# Patient Record
Sex: Male | Born: 1960 | Race: White | Hispanic: No | Marital: Single | Smoking: Current every day smoker
Health system: Southern US, Community
[De-identification: ages and names within clinical notes are randomized; demographics above are authoritative.]

## PROBLEM LIST (undated history)

## (undated) DIAGNOSIS — E119 Type 2 diabetes mellitus without complications: Secondary | ICD-10-CM

## (undated) DIAGNOSIS — E039 Hypothyroidism, unspecified: Secondary | ICD-10-CM

## (undated) HISTORY — PX: NOSE SURGERY: SHX723

## (undated) HISTORY — DX: Type 2 diabetes mellitus without complications: E11.9

## (undated) HISTORY — DX: Hypothyroidism, unspecified: E03.9

## (undated) HISTORY — PX: KNEE SURGERY: SHX244

---

## 2009-01-07 ENCOUNTER — Emergency Department (HOSPITAL_COMMUNITY): Admission: EM | Admit: 2009-01-07 | Discharge: 2009-01-07 | Payer: Self-pay | Admitting: Emergency Medicine

## 2009-08-26 ENCOUNTER — Emergency Department (HOSPITAL_COMMUNITY): Admission: EM | Admit: 2009-08-26 | Discharge: 2009-08-26 | Payer: Self-pay | Admitting: Emergency Medicine

## 2009-09-11 ENCOUNTER — Emergency Department (HOSPITAL_COMMUNITY): Admission: EM | Admit: 2009-09-11 | Discharge: 2009-09-11 | Payer: Self-pay | Admitting: Emergency Medicine

## 2010-07-23 LAB — COMPREHENSIVE METABOLIC PANEL
AST: 22 U/L (ref 0–37)
Alkaline Phosphatase: 56 U/L (ref 39–117)
BUN: 15 mg/dL (ref 6–23)
Chloride: 109 mEq/L (ref 96–112)
Creatinine, Ser: 0.83 mg/dL (ref 0.4–1.5)
GFR calc non Af Amer: >60 mL/min (ref >60)
Glucose, Bld: 86 mg/dL (ref 70–99)
Potassium: 3.4 mEq/L — ABNORMAL LOW (ref 3.5–5.1)
Total Bilirubin: 0.3 mg/dL (ref 0.3–1.2)
Total Protein: 6.4 g/dL (ref 6.0–8.3)

## 2010-07-23 LAB — CBC
Platelets: 164 10*3/uL (ref 150–400)
WBC: 7.2 10*3/uL (ref 4.0–10.5)

## 2010-07-23 LAB — DIFFERENTIAL
Basophils Absolute: 0 10*3/uL (ref 0.0–0.1)
Basophils Relative: 1 % (ref 0–1)
Eosinophils Absolute: 0.1 10*3/uL (ref 0.0–0.7)

## 2010-07-23 LAB — POCT CARDIAC MARKERS: Myoglobin, poc: 34.8 ng/mL (ref 12–200)

## 2010-08-09 LAB — POCT CARDIAC MARKERS
Myoglobin, poc: 54.8 ng/mL (ref 12–200)
Troponin i, poc: <0.05 ng/mL (ref 0.00–0.09)

## 2010-08-09 LAB — BASIC METABOLIC PANEL
CO2: 27 mEq/L (ref 19–32)
Glucose, Bld: 119 mg/dL — ABNORMAL HIGH (ref 70–99)
Sodium: 138 mEq/L (ref 135–145)

## 2010-08-09 LAB — D-DIMER, QUANTITATIVE: D-Dimer, Quant: 0.34 ug/mL-FEU (ref 0.00–0.48)

## 2010-08-09 LAB — CBC
MCHC: 35 g/dL (ref 30.0–36.0)
MCV: 100.8 fL — ABNORMAL HIGH (ref 78.0–100.0)
Platelets: 206 10*3/uL (ref 150–400)
RDW: 14.7 % (ref 11.5–15.5)

## 2010-08-09 LAB — DIFFERENTIAL
Basophils Absolute: 0 10*3/uL (ref 0.0–0.1)
Lymphocytes Relative: 13 % (ref 12–46)
Lymphs Abs: 1.3 10*3/uL (ref 0.7–4.0)
Monocytes Absolute: 0.5 10*3/uL (ref 0.1–1.0)
Neutrophils Relative %: 82 % — ABNORMAL HIGH (ref 43–77)
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
## 2022-05-13 ENCOUNTER — Ambulatory Visit (INDEPENDENT_AMBULATORY_CARE_PROVIDER_SITE_OTHER): Payer: No Typology Code available for payment source | Admitting: "Endocrinology

## 2022-05-13 ENCOUNTER — Encounter: Payer: Self-pay | Admitting: "Endocrinology

## 2022-05-13 VITALS — BP 114/76 | HR 68 | Ht 70.0 in | Wt 181.6 lb

## 2022-05-13 DIAGNOSIS — Z794 Long term (current) use of insulin: Secondary | ICD-10-CM

## 2022-05-13 DIAGNOSIS — E039 Hypothyroidism, unspecified: Secondary | ICD-10-CM

## 2022-05-13 DIAGNOSIS — E1165 Type 2 diabetes mellitus with hyperglycemia: Secondary | ICD-10-CM

## 2022-05-13 DIAGNOSIS — E782 Mixed hyperlipidemia: Secondary | ICD-10-CM | POA: Diagnosis not present

## 2022-05-13 DIAGNOSIS — F17200 Nicotine dependence, unspecified, uncomplicated: Secondary | ICD-10-CM | POA: Insufficient documentation

## 2022-05-13 DIAGNOSIS — I10 Essential (primary) hypertension: Secondary | ICD-10-CM

## 2022-05-13 NOTE — Progress Notes (Signed)
Endocrinology Consult Note       05/13/2022, 4:09 PM   Subjective:    Patient ID: Colin Austin, male    DOB: 1960-05-09.  Colin Austin is being seen in consultation for management of currently uncontrolled symptomatic diabetes requested by  [facility] [employer].   Past Medical History:  Diagnosis Date   Diabetes mellitus, type II (HCC)    Hypothyroidism     Past Surgical History:  Procedure Laterality Date   KNEE SURGERY     NOSE SURGERY      Social History   Socioeconomic History   Marital status: Single    Spouse name: Not on file   Number of children: Not on file   Years of education: Not on file   Highest education level: Not on file  Occupational History   Not on file  Tobacco Use   Smoking status: Every Day    Types: Cigarettes   Smokeless tobacco: Not on file  Vaping Use   Vaping Use: Never used  Substance and Sexual Activity   Alcohol use: Not Currently   Drug use: Never   Sexual activity: Not on file  Other Topics Concern   Not on file  Social History Narrative   Not on file   Social Determinants of Health   Financial Resource Strain: Not on file  Food Insecurity: Not on file  Transportation Needs: Not on file  Physical Activity: Not on file  Stress: Not on file  Social Connections: Not on file    Family History  Problem Relation Age of Onset   Cancer Mother    Thyroid disease Mother    Diabetes Mother    Cancer Father    Hypertension Father    Diabetes Father    Hyperlipidemia Father    Heart attack Father    Stroke Father     Outpatient Encounter Medications as of 05/13/2022  Medication Sig   albuterol (VENTOLIN HFA) 108 (90 Base) MCG/ACT inhaler Inhale 2 puffs every 4 hours by inhalation route.   Budeson-Glycopyrrol-Formoterol 160-9-4.8 MCG/ACT AERO INHALE 2 PUFFS BY ORAL INHALATION TWICE A DAY . RINSE MOUTH AFTER EACH USE TO PREVENT ORAL THRUSH.    budesonide (PULMICORT) 0.5 MG/2ML nebulizer solution Inhale 2 mL twice a day by nebulization route for 90 days.   buPROPion (ZYBAN) 150 MG 12 hr tablet Take 150 mg by mouth daily.   hydrOXYzine (ATARAX) 25 MG tablet Take 25 mg by mouth 3 (three) times daily.   insulin glargine-yfgn (SEMGLEE) 100 UNIT/ML Pen Inject 20 Units into the skin at bedtime.   levothyroxine (SYNTHROID) 175 MCG tablet Take 175 mcg by mouth daily before breakfast.   metFORMIN (GLUCOPHAGE) 1000 MG tablet Take 1,000 mg by mouth 2 (two) times daily with a meal.   naltrexone (DEPADE) 50 MG tablet Take 50 mg by mouth daily.   Omega-3 Fatty Acids (FISH OIL OMEGA-3 PO) Take 2 capsules by mouth daily.   omeprazole (PRILOSEC) 40 MG capsule Take 1 capsule by mouth daily.   PARoxetine (PAXIL) 20 MG tablet Take 20 mg by mouth daily.   pregabalin (LYRICA) 200 MG capsule Take  200 mg by mouth 2 (two) times daily.   rosuvastatin (CRESTOR) 10 MG tablet Take 10 mg by mouth daily.   sildenafil (VIAGRA) 100 MG tablet Take 100 mg by mouth as needed.   traZODone (DESYREL) 50 MG tablet Take 50 mg by mouth at bedtime.   [DISCONTINUED] clonazePAM (KLONOPIN PO) 0.5 mg daily.   No facility-administered encounter medications on file as of 05/13/2022.    ALLERGIES: No Known Allergies  VACCINATION STATUS: Immunization History  Administered Date(s) Administered   PFIZER(Purple Top)SARS-COV-2 Vaccination 04/10/2020    Diabetes He presents for his initial diabetic visit. He has type 2 diabetes mellitus. Onset time: He was diagnosed at the proximal range of 62 years. There are no hypoglycemic associated symptoms. Pertinent negatives for hypoglycemia include no confusion, headaches, pallor or seizures. Associated symptoms include polydipsia and polyuria. Pertinent negatives for diabetes include no chest pain, no fatigue, no polyphagia and no weakness. There are no hypoglycemic complications. Symptoms are stable. Risk factors for coronary artery  disease include dyslipidemia, diabetes mellitus, family history, sedentary lifestyle, tobacco exposure and male sex. Current diabetic treatment includes oral agent (monotherapy). His weight is fluctuating minimally. He is following a generally unhealthy diet. When asked about meal planning, he reported none. He has not had a previous visit with a dietitian. He rarely participates in exercise. His home blood glucose trend is fluctuating minimally. (Gives history of alcohol abuse/chronic use requiring alcohol detox in November 2023. He did not bring any logs nor meter with him to review.  His recent point-of-care A1c was 9.7%.  He is on metformin 1000 mg p.o. twice daily.  He is also on Semglee 15 units nightly.) An ACE inhibitor/angiotensin II receptor blocker is not being taken.  Hyperlipidemia This is a chronic problem. The current episode started more than 1 year ago. The problem is controlled. Exacerbating diseases include diabetes. Pertinent negatives include no chest pain, myalgias or shortness of breath. Current antihyperlipidemic treatment includes statins. Risk factors for coronary artery disease include diabetes mellitus, dyslipidemia, male sex and a sedentary lifestyle.     Review of Systems  Constitutional:  Negative for chills, fatigue, fever and unexpected weight change.  HENT:  Negative for dental problem, mouth sores and trouble swallowing.   Eyes:  Negative for visual disturbance.  Respiratory:  Negative for cough, choking, chest tightness, shortness of breath and wheezing.   Cardiovascular:  Negative for chest pain, palpitations and leg swelling.  Gastrointestinal:  Negative for abdominal distention, abdominal pain, constipation, diarrhea, nausea and vomiting.  Endocrine: Positive for polydipsia and polyuria. Negative for polyphagia.  Genitourinary:  Negative for dysuria, flank pain, hematuria and urgency.  Musculoskeletal:  Negative for back pain, gait problem, myalgias and neck  pain.  Skin:  Negative for pallor, rash and wound.  Neurological:  Negative for seizures, syncope, weakness, numbness and headaches.  Psychiatric/Behavioral: Negative.  Negative for confusion and dysphoric mood.     Objective:       05/13/2022    8:52 AM  Vitals with BMI  Height 5\' 10"   Weight 181 lbs 10 oz  BMI 27.25  Systolic 366  Diastolic 76  Pulse 68    BP 114/76   Pulse 68   Ht 5\' 10"  (1.778 m)   Wt 181 lb 9.6 oz (82.4 kg)   BMI 26.06 kg/m   Wt Readings from Last 3 Encounters:  05/13/22 181 lb 9.6 oz (82.4 kg)     Physical Exam Constitutional:      General: He is  not in acute distress.    Appearance: He is well-developed.  HENT:     Head: Normocephalic and atraumatic.  Neck:     Thyroid: No thyromegaly.     Trachea: No tracheal deviation.  Cardiovascular:     Rate and Rhythm: Normal rate.     Pulses:          Dorsalis pedis pulses are 1+ on the right side and 1+ on the left side.       Posterior tibial pulses are 1+ on the right side and 1+ on the left side.     Heart sounds: Normal heart sounds, S1 normal and S2 normal. No murmur heard.    No gallop.  Pulmonary:     Effort: Pulmonary effort is normal. No respiratory distress.     Breath sounds: No wheezing.     Comments: Bilateral wheezes on lung auscultation. Abdominal:     General: Bowel sounds are normal. There is no distension.     Palpations: Abdomen is soft.     Tenderness: There is no abdominal tenderness. There is no guarding.  Musculoskeletal:     Right shoulder: No swelling or deformity.     Cervical back: Normal range of motion and neck supple.  Skin:    General: Skin is warm and dry.     Findings: No rash.     Nails: There is no clubbing.  Neurological:     Mental Status: He is alert and oriented to person, place, and time.     Cranial Nerves: No cranial nerve deficit.     Sensory: No sensory deficit.     Gait: Gait normal.     Deep Tendon Reflexes: Reflexes are normal and symmetric.   Psychiatric:        Speech: Speech normal.        Behavior: Behavior normal. Behavior is cooperative.        Thought Content: Thought content normal.        Judgment: Judgment normal.       CMP ( most recent) CMP     Component Value Date/Time   NA 139 08/26/2009 1925   K 3.4 (L) 08/26/2009 1925   CL 109 08/26/2009 1925   CO2 23 08/26/2009 1925   GLUCOSE 86 08/26/2009 1925   BUN 15 08/26/2009 1925   CREATININE 0.83 08/26/2009 1925   CALCIUM 8.5 08/26/2009 1925   PROT 6.4 08/26/2009 1925   ALBUMIN 3.9 08/26/2009 1925   AST 22 08/26/2009 1925   ALT 22 08/26/2009 1925   ALKPHOS 56 08/26/2009 1925   BILITOT 0.3 08/26/2009 1925   GFRNONAA >60 08/26/2009 1925   GFRAA  08/26/2009 1925    >60        The eGFR has been calculated using the MDRD equation. This calculation has not been validated in all clinical situations. eGFR's persistently <60 mL/min signify possible Chronic Kidney Disease.        Assessment & Plan:   1. 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 Recent labs reviewed. - I had a long discussion with him about the possible risk factors and  the pathology behind its diabetes and its complications. -his diabetes is complicated by alcoholism, chronic smoking, sedentary life and he remains at a high risk for more  acute and chronic complications which include CAD, CVA, CKD, retinopathy, and neuropathy. These are all discussed in detail with him.  - I discussed all available options of managing his diabetes including de-escalation of medications. I have counseled him on Food as Medicine by adopting a Whole Food , Plant Predominant  ( WFPP) nutrition as recommended by [employer] of Lifestyle Medicine. Patient is encouraged to switch to  unprocessed or  minimally processed  complex starch, adequate protein intake (mainly plant source), minimal liquid fat, plenty of fruits, and vegetables. -  he is advised to stick to a routine mealtimes to eat 3 complete meals a day and snack only when necessary ( to snack only to correct hypoglycemia BG <70 day time or <100 at night).   - he acknowledges that there is a room for improvement in his food and drink choices. - Further Specific Suggestion is made for him to avoid simple carbohydrates  from his diet including Cakes, Sweet Desserts, Ice Cream, Soda (diet and regular), Sweet Tea, Candies, Chips, Cookies, Store Bought Juices, Alcohol ,  Artificial Sweeteners,  Coffee Creamer, and "Sugar-free" Products. This will help patient to have more stable blood glucose profile and potentially avoid unintended weight gain.  The following Lifestyle Medicine recommendations according to American College of Lifestyle Medicine [facility]) were discussed and offered to patient and he agrees to start the journey:  A.  Whole Foods, Plant-based plate comprising of fruits and vegetables, plant-based proteins, whole-grain carbohydrates was discussed in detail with the patient.   A list for source of those nutrients were also provided to the patient.  Patient will use only water or unsweetened tea for hydration. B.  The need to stay away from risky substances including alcohol, smoking; obtaining 7 to 9 hours of restorative sleep, at least 150 minutes of moderate intensity exercise weekly, the importance of healthy social connections,  and stress reduction techniques were discussed. C.  A full color page of  Calorie density of various food groups per pound showing examples of each food groups was provided to the patient.  - he will be scheduled with Norm Salt, RDN, CDE for individualized diabetes education.  - I have approached him with the following individualized plan to manage  his diabetes and patient agrees:  This patient  would likely need insulin and basal/bolus fashion in order for him to control diabetes to target.  In the meantime, he is advised to increase his Semglee to 20 units nightly, advised to continue metformin 1000 mg p.o. twice daily.  He is approached to monitor blood glucose 4 times a day-before meals and at bedtime and return in 10 days with his meter and logs.   - he is encouraged to call clinic for blood glucose levels less than 70 or above 300 mg /dl. He is not a suitable candidate for DPP 4 inhibitors nor GLP-1 receptor agonist due to risk of pancreatitis.  - Specific targets for  A1c;  LDL, HDL,  and Triglycerides were discussed with the patient.  2) Blood Pressure /Hypertension:  his blood pressure is  controlled to target.  He is not on antihypertensive medications, may not tolerate. 3) Lipids/Hyperlipidemia:   Review of his recent lipid panel showed  controlled  LDL .  he  is advised to continue Crestor 10 mg daily at bedtime.  Side effects and precautions discussed with him.  4)  Weight/Diet:  Body mass index is 26.06 kg/m.  -    he is not a candidate  for weight loss. I discussed with him the fact that loss of 5 - 10% of his  current body weight will have the most impact on his diabetes management.  The above detailed  ACLM recommendations for nutrition, exercise, sleep, social life, avoidance of risky substances, the need for restorative sleep   information will also detailed on discharge instructions.  5) hypothyroidism: The circumstance of his diagnosis are not available to review.  He is currently on levothyroxine 175 mcg p.o. daily before breakfast.  - We discussed about the correct intake of his thyroid hormone, on empty stomach at fasting, with water, separated by at least 30 minutes from breakfast and other medications,  and separated by more than 4 hours from calcium, iron, multivitamins, acid reflux medications (PPIs). -Patient is made aware of the fact that thyroid hormone  replacement is needed for life, dose to be adjusted by periodic monitoring of thyroid function tests.   6) Chronic Care/Health Maintenance:  -he  is on  Statin medications , will not tolerate ACE inhibitor's and  is encouraged to initiate and continue to follow up with Ophthalmology, Dentist,  Podiatrist at least yearly or according to recommendations, and advised to  quit smoking. I have recommended yearly flu vaccine and pneumonia vaccine at least every 5 years; moderate intensity exercise for up to 150 minutes weekly; and  sleep for 7- 9 hours a day.  - he is  advised to maintain close follow up with [facility] for primary care needs, as well as his other providers for optimal and coordinated care.   I spent 65 minutes in the care of the patient today including review of labs from CMP, Lipids, Thyroid Function, Hematology (current and previous including abstractions from other facilities); face-to-face time discussing  his blood glucose readings/logs, discussing hypoglycemia and hyperglycemia episodes and symptoms, medications doses, his options of short and long term treatment based on the latest standards of care / guidelines;  discussion about incorporating lifestyle medicine;  and documenting the encounter. Risk reduction counseling performed per USPSTF guidelines to reduce  cardiovascular risk factors.      Please refer to Patient Instructions for Blood Glucose Monitoring and Insulin/Medications Dosing Guide"  in media tab for additional information. Please  also refer to " Patient Self Inventory" in the Media  tab for reviewed elements of pertinent patient history.  Colin Austin participated in the discussions, expressed understanding, and voiced agreement with the above plans.  All questions were answered to his satisfaction. he is encouraged to contact clinic should he have any questions or concerns prior to his return visit.   Follow up plan: - Return in about 10 days  (around 05/23/2022) for F/U with Meter/CGM Megan Salon Only - no Labs.  Marquis Lunch, MD [facility] [address] [place], [place] [address] Phone: [phone]  Fax: [phone]    05/13/2022, 4:09 PM  This note was partially dictated with voice recognition software. Similar sounding words can be transcribed inadequately or may not  be corrected upon review.

## 2022-05-13 NOTE — Patient Instructions (Signed)

## 2022-05-28 ENCOUNTER — Ambulatory Visit (INDEPENDENT_AMBULATORY_CARE_PROVIDER_SITE_OTHER): Payer: No Typology Code available for payment source | Admitting: "Endocrinology

## 2022-05-28 ENCOUNTER — Encounter: Payer: Self-pay | Admitting: "Endocrinology

## 2022-05-28 VITALS — BP 124/58 | HR 68 | Ht 70.0 in | Wt 186.4 lb

## 2022-05-28 DIAGNOSIS — E782 Mixed hyperlipidemia: Secondary | ICD-10-CM | POA: Diagnosis not present

## 2022-05-28 DIAGNOSIS — I10 Essential (primary) hypertension: Secondary | ICD-10-CM | POA: Diagnosis not present

## 2022-05-28 DIAGNOSIS — F17200 Nicotine dependence, unspecified, uncomplicated: Secondary | ICD-10-CM

## 2022-05-28 DIAGNOSIS — E1165 Type 2 diabetes mellitus with hyperglycemia: Secondary | ICD-10-CM

## 2022-05-28 DIAGNOSIS — Z794 Long term (current) use of insulin: Secondary | ICD-10-CM

## 2022-05-28 DIAGNOSIS — E039 Hypothyroidism, unspecified: Secondary | ICD-10-CM

## 2022-05-28 DIAGNOSIS — N529 Male erectile dysfunction, unspecified: Secondary | ICD-10-CM

## 2022-05-28 DIAGNOSIS — L853 Xerosis cutis: Secondary | ICD-10-CM

## 2022-05-28 MED ORDER — INSULIN GLARGINE-YFGN 100 UNIT/ML ~~LOC~~ SOPN: 20.0000 [IU] | PEN_INJECTOR | Freq: Every day | SUBCUTANEOUS | 1 refills | Status: DC

## 2022-05-28 NOTE — Progress Notes (Signed)
Endocrinology follow-up note       05/28/2022, 7:45 PM   Subjective:    Patient ID: Colin Austin, male    DOB: 1960-06-10.  Colin Austin is being seen in follow-up after he was sitting consultation for management of currently uncontrolled symptomatic diabetes requested by  Center, [facility] [facility].   Past Medical History:  Diagnosis Date   Diabetes mellitus, type II ([place])    Hypothyroidism     Past Surgical History:  Procedure Laterality Date   KNEE SURGERY     NOSE SURGERY      Social History   Socioeconomic History   Marital status: Single    Spouse name: Not on file   Number of children: Not on file   Years of education: Not on file   Highest education level: Not on file  Occupational History   Not on file  Tobacco Use   Smoking status: Every Day    Types: Cigarettes   Smokeless tobacco: Not on file  Vaping Use   Vaping Use: Never used  Substance and Sexual Activity   Alcohol use: Not Currently   Drug use: Never   Sexual activity: Not on file  Other Topics Concern   Not on file  Social History Narrative   Not on file   Social Determinants of Health   Financial Resource Strain: Not on file  Food Insecurity: Not on file  Transportation Needs: Not on file  Physical Activity: Not on file  Stress: Not on file  Social Connections: Not on file    Family History  Problem Relation Age of Onset   Cancer Mother    Thyroid disease Mother    Diabetes Mother    Cancer Father    Hypertension Father    Diabetes Father    Hyperlipidemia Father    Heart attack Father    Stroke Father     Outpatient Encounter Medications as of 05/28/2022  Medication Sig   albuterol (VENTOLIN HFA) 108 (90 Base) MCG/ACT inhaler Inhale 2 puffs every 4 hours by inhalation route.   Budeson-Glycopyrrol-Formoterol 160-9-4.8 MCG/ACT AERO INHALE 2 PUFFS BY ORAL INHALATION TWICE A DAY . RINSE MOUTH AFTER  EACH USE TO PREVENT ORAL THRUSH.   budesonide (PULMICORT) 0.5 MG/2ML nebulizer solution Inhale 2 mL twice a day by nebulization route for 90 days.   buPROPion (ZYBAN) 150 MG 12 hr tablet Take 150 mg by mouth daily.   hydrOXYzine (ATARAX) 25 MG tablet Take 25 mg by mouth 3 (three) times daily.   insulin glargine-yfgn (SEMGLEE) 100 UNIT/ML Pen Inject 20 Units into the skin at bedtime.   levothyroxine (SYNTHROID) 175 MCG tablet Take 175 mcg by mouth daily before breakfast.   metFORMIN (GLUCOPHAGE) 1000 MG tablet Take 1,000 mg by mouth 2 (two) times daily with a meal.   naltrexone (DEPADE) 50 MG tablet Take 50 mg by mouth daily.   Omega-3 Fatty Acids (FISH OIL OMEGA-3 PO) Take 2 capsules by mouth daily.   omeprazole (PRILOSEC) 40 MG capsule Take 1 capsule by mouth daily.   PARoxetine (PAXIL) 20 MG tablet Take 20 mg by mouth daily.   pregabalin (LYRICA) 200  MG capsule Take 200 mg by mouth 2 (two) times daily.   rosuvastatin (CRESTOR) 10 MG tablet Take 10 mg by mouth daily.   sildenafil (VIAGRA) 100 MG tablet Take 100 mg by mouth as needed.   traZODone (DESYREL) 50 MG tablet Take 50 mg by mouth at bedtime.   [DISCONTINUED] insulin glargine-yfgn (SEMGLEE) 100 UNIT/ML Pen Inject 20 Units into the skin at bedtime.   No facility-administered encounter medications on file as of 05/28/2022.    ALLERGIES: No Known Allergies  VACCINATION STATUS: Immunization History  Administered Date(s) Administered   PFIZER(Purple Top)SARS-COV-2 Vaccination 04/10/2020    Diabetes He presents for his follow-up diabetic visit. He has type 2 diabetes mellitus. Onset time: He was diagnosed at the proximal range of 59 years. His disease course has been improving. There are no hypoglycemic associated symptoms. Pertinent negatives for hypoglycemia include no confusion, headaches, pallor or seizures. Associated symptoms include polydipsia and polyuria. Pertinent negatives for diabetes include no chest pain, no fatigue, no  polyphagia and no weakness. There are no hypoglycemic complications. Symptoms are stable. Risk factors for coronary artery disease include dyslipidemia, diabetes mellitus, family history, sedentary lifestyle, tobacco exposure and male sex. Current diabetic treatment includes oral agent (monotherapy). His weight is increasing steadily. He is following a generally unhealthy diet. When asked about meal planning, he reported none. He has not had a previous visit with a dietitian. He rarely participates in exercise. His home blood glucose trend is decreasing steadily. His breakfast blood glucose range is generally 130-140 mg/dl. His bedtime blood glucose range is generally 140-180 mg/dl. His overall blood glucose range is 140-180 mg/dl. (Gives history of alcohol abuse/chronic use requiring alcohol detox in November 2023.  He presents with significantly improved glycemic profile after making some changes in his diet including decreased consumption of sweetened beverages.  He remains on Semglee 20 units nightly and metformin 1000 mg p.o. twice daily.  He did not develop any hypoglycemia.  His recent A1c was 9.7%.  ) An ACE inhibitor/angiotensin II receptor blocker is not being taken.  Hyperlipidemia This is a chronic problem. The current episode started more than 1 year ago. The problem is controlled. Exacerbating diseases include diabetes. Pertinent negatives include no chest pain, myalgias or shortness of breath. Current antihyperlipidemic treatment includes statins. Risk factors for coronary artery disease include diabetes mellitus, dyslipidemia, male sex and a sedentary lifestyle.     Review of Systems  Constitutional:  Negative for chills, fatigue, fever and unexpected weight change.  HENT:  Negative for dental problem, mouth sores and trouble swallowing.   Eyes:  Negative for visual disturbance.  Respiratory:  Negative for cough, choking, chest tightness, shortness of breath and wheezing.    Cardiovascular:  Negative for chest pain, palpitations and leg swelling.  Gastrointestinal:  Negative for abdominal distention, abdominal pain, constipation, diarrhea, nausea and vomiting.  Endocrine: Positive for polydipsia and polyuria. Negative for polyphagia.  Genitourinary:  Negative for dysuria, flank pain, hematuria and urgency.  Musculoskeletal:  Negative for back pain, gait problem, myalgias and neck pain.  Skin:  Negative for pallor, rash and wound.  Neurological:  Negative for seizures, syncope, weakness, numbness and headaches.  Psychiatric/Behavioral: Negative.  Negative for confusion and dysphoric mood.     Objective:       05/28/2022   10:52 AM 05/13/2022    8:52 AM  Vitals with BMI  Height 5\' 10"  5\' 10"   Weight 186 lbs 6 oz 181 lbs 10 oz  BMI 81.44 81.85  Systolic  124 114  Diastolic 58 76  Pulse 68 68    BP (!) 124/58   Pulse 68   Ht 5\' 10"  (1.778 m)   Wt 186 lb 6.4 oz (84.6 kg)   BMI 26.75 kg/m   Wt Readings from Last 3 Encounters:  05/28/22 186 lb 6.4 oz (84.6 kg)  05/13/22 181 lb 9.6 oz (82.4 kg)      CMP ( most recent) CMP     Component Value Date/Time   NA 139 08/26/2009 1925   K 3.4 (L) 08/26/2009 1925   CL 109 08/26/2009 1925   CO2 23 08/26/2009 1925   GLUCOSE 86 08/26/2009 1925   BUN 15 08/26/2009 1925   CREATININE 0.83 08/26/2009 1925   CALCIUM 8.5 08/26/2009 1925   PROT 6.4 08/26/2009 1925   ALBUMIN 3.9 08/26/2009 1925   AST 22 08/26/2009 1925   ALT 22 08/26/2009 1925   ALKPHOS 56 08/26/2009 1925   BILITOT 0.3 08/26/2009 1925   GFRNONAA >60 08/26/2009 1925   GFRAA  08/26/2009 1925    >60        The eGFR has been calculated using the MDRD equation. This calculation has not been validated in all clinical situations. eGFR's persistently <60 mL/min signify possible Chronic Kidney Disease.     Assessment & Plan:   1. 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 Gives history of alcohol abuse/chronic use requiring alcohol detox in November 2023.  He presents with significantly improved glycemic profile after making some changes in his diet including decreased consumption of sweetened beverages.  He remains on Semglee 20 units nightly and metformin 1000 mg p.o. twice daily.  He did not develop any hypoglycemia.  His recent A1c was 9.7%.  - I had a long discussion with him about the possible risk factors and  the pathology behind its diabetes and its complications. 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 These are all discussed in detail with him.  - I discussed all available options of managing his diabetes including de-escalation of medications. I have counseled him on Food as Medicine by adopting a Whole Food , Plant Predominant  ( WFPP) nutrition as recommended by December 2023 of Lifestyle Medicine. Patient is encouraged to switch to  unprocessed or minimally processed  complex starch, adequate protein intake (mainly plant source), minimal liquid fat, plenty of fruits, and vegetables. -  he is advised to stick to a routine mealtimes to eat 3 complete meals a day and snack only when necessary ( to snack only to correct hypoglycemia BG <70 day time or <100 at night).   - he acknowledges that there is a room for improvement in his food and drink choices.  The most important lifestyle changes to make is staying off of alcohol.   - Suggestion is made for him to avoid simple carbohydrates  from his diet including Cakes, Sweet Desserts, Ice Cream, Soda (diet and regular), Sweet Tea, Candies, Chips, Cookies, Store Bought Juices, Alcohol , Artificial Sweeteners,  Coffee  Creamer, and "Sugar-free" Products, Lemonade. This will help patient to have more stable blood glucose profile and potentially avoid unintended weight gain.  The following Lifestyle Medicine recommendations according  to [employer] of Lifestyle Medicine  [facility]) were discussed and and offered to patient and he  agrees to start the journey:  A. Whole Foods, Plant-Based Nutrition comprising of fruits and vegetables, plant-based proteins, whole-grain carbohydrates was discussed in detail with the patient.   A list for source of those nutrients were also provided to the patient.  Patient will use only water or unsweetened tea for hydration. B.  The need to stay away from risky substances including alcohol, smoking; obtaining 7 to 9 hours of restorative sleep, at least 150 minutes of moderate intensity exercise weekly, the importance of healthy social connections,  and stress management techniques were discussed. C.  A full color page of  Calorie density of various food groups per pound showing examples of each food groups was provided to the patient.   - he will be scheduled with Norm Salt, RDN, CDE for individualized diabetes education.  - I have approached him with the following individualized plan to manage  his diabetes and patient agrees:  Given his history of heavy alcohol history, this patient would likely need  basal/bolus fashion in order for him to control diabetes to target.  However, in the meantime he is achieving reasonable control.  He is advised to continue Semglee 20 units nightly, continue metformin 1000 mg p.o. twice daily. -He is willing and advised to continue monitoring blood glucose twice a day-daily before breakfast and at bedtime.  - he is encouraged to call clinic for blood glucose levels less than 70 or above 300 mg /dl. He is not a suitable candidate for DPP 4 inhibitors nor GLP-1 receptor agonist due to risk of pancreatitis.  - Specific targets for  A1c;  LDL,  HDL,  and Triglycerides were discussed with the patient.  2) Blood Pressure /Hypertension:   His blood pressure is controlled to target.   He is not on antihypertensive medications, may not tolerate. 3) Lipids/Hyperlipidemia:   Review of his recent lipid panel showed  controlled  LDL .  he  is advised to continue Crestor 10 mg p.o. daily at bedtime.  Side effects and precautions discussed with him.  4)  Weight/Diet:  Body mass index is 26.75 kg/m.  -    he is not a candidate for weight loss. I discussed with him the fact that loss of 5 - 10% of his  current body weight will have the most impact on his diabetes management.  The above detailed  ACLM recommendations for nutrition, exercise, sleep, social life, avoidance of risky substances, the need for restorative sleep   information will also detailed on discharge instructions.  5) hypothyroidism: The circumstance of his diagnosis are not available to review.  He is currently on levothyroxine 175 mcg p.o. daily before breakfast.    - We discussed about the correct intake of his thyroid hormone, on empty stomach at fasting, with water, separated by at least 30 minutes from breakfast and other medications,  and separated by more than 4 hours from calcium, iron, multivitamins, acid reflux medications (PPIs). -Patient is made aware of the fact that thyroid hormone replacement is needed for life, dose to be adjusted by periodic monitoring of thyroid function tests.   6) Chronic Care/Health Maintenance:  -he  is on  Statin medications , will not tolerate ACE inhibitor's and  is encouraged to initiate and continue to follow up with Ophthalmology, Dentist,  Podiatrist at least yearly or according to recommendations, and advised to  quit smoking. I have  recommended yearly flu vaccine and pneumonia vaccine at least every 5 years; moderate intensity exercise for up to 150 minutes weekly; and  sleep for 7- 9 hours a day.  The patient was counseled on the  dangers of tobacco use, and was advised to quit.  Reviewed strategies to maximize success, including removing cigarettes and smoking materials from environment.   - he is  advised to maintain close follow up with [facility] for primary care needs, as well as his other providers for optimal and coordinated care.    Follow up plan: - Return in about 3 months (around 08/27/2022) for F/U with Pre-visit Labs, Meter/CGM/Logs, A1c here.  Marquis Lunch, MD [facility] [address] [place], [place] [address] Phone: [phone]  Fax: [phone]    05/28/2022, 7:45 PM  This note was partially dictated with voice recognition software. Similar sounding words can be transcribed inadequately or may not  be corrected upon review.

## 2022-05-28 NOTE — Patient Instructions (Signed)

## 2022-06-06 ENCOUNTER — Other Ambulatory Visit: Payer: Self-pay

## 2022-06-06 DIAGNOSIS — E1165 Type 2 diabetes mellitus with hyperglycemia: Secondary | ICD-10-CM

## 2022-06-06 MED ORDER — PEN NEEDLES 30G X 8 MM MISC: 2 refills | Status: DC

## 2022-06-23 ENCOUNTER — Ambulatory Visit: Payer: Self-pay | Admitting: Nutrition

## 2022-07-14 ENCOUNTER — Encounter: Payer: No Typology Code available for payment source | Attending: "Endocrinology | Admitting: Nutrition

## 2022-07-14 DIAGNOSIS — E1165 Type 2 diabetes mellitus with hyperglycemia: Secondary | ICD-10-CM | POA: Insufficient documentation

## 2022-07-14 DIAGNOSIS — Z794 Long term (current) use of insulin: Secondary | ICD-10-CM | POA: Insufficient documentation

## 2022-07-14 DIAGNOSIS — Z713 Dietary counseling and surveillance: Secondary | ICD-10-CM | POA: Insufficient documentation

## 2022-07-28 ENCOUNTER — Encounter: Payer: No Typology Code available for payment source | Admitting: Nutrition

## 2022-07-28 ENCOUNTER — Encounter: Payer: Self-pay | Admitting: Nutrition

## 2022-07-28 VITALS — Ht 70.0 in | Wt 196.0 lb

## 2022-07-28 DIAGNOSIS — I10 Essential (primary) hypertension: Secondary | ICD-10-CM

## 2022-07-28 DIAGNOSIS — Z713 Dietary counseling and surveillance: Secondary | ICD-10-CM | POA: Diagnosis not present

## 2022-07-28 DIAGNOSIS — Z794 Long term (current) use of insulin: Secondary | ICD-10-CM | POA: Diagnosis not present

## 2022-07-28 DIAGNOSIS — E782 Mixed hyperlipidemia: Secondary | ICD-10-CM

## 2022-07-28 DIAGNOSIS — E1165 Type 2 diabetes mellitus with hyperglycemia: Secondary | ICD-10-CM

## 2022-07-28 NOTE — Progress Notes (Signed)
Medical Nutrition Therapy  Appointment Start time:  1500  Appointment End time:  1600  Primary concerns today: DM Dm Type 2   Referral diagnosis: E11. Preferred learning style: NO Preference. Learning readiness: Change in progress.   NUTRITION ASSESSMENT  62  yr old wmale here for Type 2 DM Education. Sees Dr. Dorris Fetch, Endocrinology. He notes his last A1C was 13% when he was in the hospital with Pancreatitis. H/o alcoholism. Has been sober for 4-5 months now. Attends zoom meetings with the [facility] online for support. Has had DM for 5 years or so.  Goes to the [place] and [place] for his medical appts.  He has been taking his insulin as prescribed. Has changed his eating habits and his BS are much better. FBS 110-140's Bedtime 150's. Semglee 20 units at night, Metformin 1000 mg BID.  He notes he cut out a lot of processed junk food and eating more fruits, vegetables and leaner meats. Still eats some animal meat.  Still smoking but working on cutting down. Anthropometrics  Wt Readings from Last 3 Encounters:  07/28/22 196 lb (88.9 kg)  05/28/22 186 lb 6.4 oz (84.6 kg)  05/13/22 181 lb 9.6 oz (82.4 kg)   Ht Readings from Last 3 Encounters:  07/28/22 5\' 10"  (1.778 m)  05/28/22 5\' 10"  (1.778 m)  05/13/22 5\' 10"  (1.778 m)   Body mass index is 28.12 kg/m. @BMIFA @ Facility age limit for growth %iles is 20 years. Facility age limit for growth %iles is 20 years.    Clinical Medical Hx: See chart Medications: Semglee, Metformin Labs: will get A1C done next month Notable Signs/Symptoms: none  Lifestyle & Dietary Hx LIves with girlfriend. He is out of work right now. Is a Engineer, building services.  Estimated daily fluid intake: 60 oz Supplements: VIt D Sleep:  Stress / self-care:  Current average weekly physical activity: ADL  24-Hr Dietary Recall First Meal: Oatmeal, old fashion,  Snack:  Second Meal: [place] sandwich on ww bread, water Snack:  Third Meal:Chicken thigh, baked,  Cauliflower rice, Broccoli,  water Snack: ice cream Beverages: water  Estimated Energy Needs Calories: 1800-2000 Carbohydrate: 225g Protein: 150g Fat: 56g   NUTRITION DIAGNOSIS  NB-1.1 Food and nutrition-related knowledge deficit As related to Diabetes Type 2.  As evidenced by A1C >13% with pancreatitis.   NUTRITION INTERVENTION  Nutrition education (E-1) on the following topics:  Nutrition and Diabetes education provided on My Plate, CHO counting, meal planning, portion sizes, timing of meals, avoiding snacks between meals unless having a low blood sugar, target ranges for A1C and blood sugars, signs/symptoms and treatment of hyper/hypoglycemia, monitoring blood sugars, taking medications as prescribed, benefits of exercising 30 minutes per day and prevention of complications of DM.  Lifestyle Medicine  - Whole Food, Plant Predominant Nutrition is highly recommended: Eat Plenty of vegetables, Mushrooms, fruits, Legumes, Whole Grains, Nuts, seeds in lieu of processed meats, processed snacks/pastries red meat, poultry, eggs.    -It is better to avoid simple carbohydrates including: Cakes, Sweet Desserts, Ice Cream, Soda (diet and regular), Sweet Tea, Candies, Chips, Cookies, Store Bought Juices, Alcohol in Excess of  1-2 drinks a day, Lemonade,  Artificial Sweeteners, Doughnuts, Coffee Creamers, "Sugar-free" Products, etc, etc.  This is not a complete list.....  Exercise: If you are able: 30 -60 minutes a day ,4 days a week, or 150 minutes a week.  The longer the better.  Combine stretch, strength, and aerobic activities.  If you were told in the past that you  have high risk for cardiovascular diseases, you may seek evaluation by your heart doctor prior to initiating moderate to intense exercise programs.   Handouts Provided Include  Lifestyle Medicine Know your numbers   Learning Style & Readiness for Change Teaching method utilized: Visual & Auditory  Demonstrated degree of  understanding via: Teach Back  Barriers to learning/adherence to lifestyle change: none  Goals Established by Pt . Goals Add more whole foods of high fiber; sweet potatoes,  dried beans,  Avoid processed sugars of ice cream and other sweets Get A1C to 6% or less.  MONITORING & EVALUATION Dietary intake, weekly physical activity, and blood work in 1 month.  Next Steps  Patient is to work on meal planning and increasing high fiber foods.Marland Kitchen

## 2022-07-28 NOTE — Patient Instructions (Addendum)
Goals Add more whole foods of high fiber; sweet potatoes,  dried beans,  Avoid processed sugars of ice cream and other sweets Get A1C to 6% or less.

## 2022-08-14 ENCOUNTER — Telehealth: Payer: Self-pay | Admitting: "Endocrinology

## 2022-08-14 NOTE — Telephone Encounter (Signed)
Sent medical release request to [email or web address]

## 2022-08-21 LAB — T4, FREE: Free T4: 1.76 ng/dL (ref 0.82–1.77)

## 2022-08-21 LAB — LIPID PANEL
Chol/HDL Ratio: 2.1 ratio (ref 0.0–5.0)
Cholesterol, Total: 130 mg/dL (ref 100–199)
HDL: 61 mg/dL (ref >39)
LDL Chol Calc (NIH): 53 mg/dL (ref 0–99)
Triglycerides: 79 mg/dL (ref 0–149)
VLDL Cholesterol Cal: 16 mg/dL (ref 5–40)

## 2022-08-21 LAB — COMPREHENSIVE METABOLIC PANEL
ALT: 15 IU/L (ref 0–44)
AST: 12 IU/L (ref 0–40)
Albumin/Globulin Ratio: 2.1 (ref 1.2–2.2)
Albumin: 4.7 g/dL (ref 3.8–4.9)
Alkaline Phosphatase: 75 IU/L (ref 44–121)
BUN/Creatinine Ratio: 19 (ref 10–24)
BUN: 16 mg/dL (ref 8–27)
Bilirubin Total: 0.4 mg/dL (ref 0.0–1.2)
CO2: 23 mmol/L (ref 20–29)
Calcium: 9.6 mg/dL (ref 8.6–10.2)
Chloride: 100 mmol/L (ref 96–106)
Creatinine, Ser: 0.84 mg/dL (ref 0.76–1.27)
Globulin, Total: 2.2 g/dL (ref 1.5–4.5)
Glucose: 124 mg/dL — ABNORMAL HIGH (ref 70–99)
Potassium: 5.2 mmol/L (ref 3.5–5.2)
Sodium: 141 mmol/L (ref 134–144)
Total Protein: 6.9 g/dL (ref 6.0–8.5)
eGFR: 100 mL/min/{1.73_m2} (ref >59)

## 2022-08-21 LAB — VITAMIN D 25 HYDROXY (VIT D DEFICIENCY, FRACTURES): Vit D, 25-Hydroxy: 65 ng/mL (ref 30.0–100.0)

## 2022-08-21 LAB — VITAMIN B12: Vitamin B-12: 245 pg/mL (ref 232–1245)

## 2022-08-21 LAB — TSH: TSH: 0.168 u[IU]/mL — ABNORMAL LOW (ref 0.450–4.500)

## 2022-08-28 ENCOUNTER — Encounter: Payer: Self-pay | Admitting: "Endocrinology

## 2022-08-28 ENCOUNTER — Ambulatory Visit: Payer: No Typology Code available for payment source | Admitting: "Endocrinology

## 2022-08-28 ENCOUNTER — Encounter: Payer: Self-pay | Admitting: Nutrition

## 2022-08-28 ENCOUNTER — Encounter: Payer: No Typology Code available for payment source | Attending: "Endocrinology | Admitting: Nutrition

## 2022-08-28 VITALS — Wt 200.6 lb

## 2022-08-28 VITALS — BP 108/66 | HR 72 | Ht 70.0 in | Wt 200.4 lb

## 2022-08-28 DIAGNOSIS — Z794 Long term (current) use of insulin: Secondary | ICD-10-CM | POA: Diagnosis not present

## 2022-08-28 DIAGNOSIS — E1165 Type 2 diabetes mellitus with hyperglycemia: Secondary | ICD-10-CM

## 2022-08-28 DIAGNOSIS — I10 Essential (primary) hypertension: Secondary | ICD-10-CM | POA: Diagnosis not present

## 2022-08-28 DIAGNOSIS — E782 Mixed hyperlipidemia: Secondary | ICD-10-CM

## 2022-08-28 DIAGNOSIS — Z713 Dietary counseling and surveillance: Secondary | ICD-10-CM | POA: Insufficient documentation

## 2022-08-28 DIAGNOSIS — F17200 Nicotine dependence, unspecified, uncomplicated: Secondary | ICD-10-CM

## 2022-08-28 DIAGNOSIS — E039 Hypothyroidism, unspecified: Secondary | ICD-10-CM | POA: Diagnosis not present

## 2022-08-28 LAB — POCT GLYCOSYLATED HEMOGLOBIN (HGB A1C): HbA1c, POC (controlled diabetic range): 7 % (ref 0.0–7.0)

## 2022-08-28 MED ORDER — LEVOTHYROXINE SODIUM 150 MCG PO TABS: 150.0000 ug | ORAL_TABLET | Freq: Every day | ORAL | 1 refills | Status: DC

## 2022-08-28 NOTE — Progress Notes (Signed)
Endocrinology follow-up note       08/28/2022, 8:22 PM   Subjective:    Patient ID: Colin Austin, male    DOB: Oct 13, 1960.  Colin Austin is being seen in follow-up after he was sitting consultation for management of currently uncontrolled symptomatic diabetes requested by  Damaris Schooner.   Past Medical History:  Diagnosis Date   Diabetes mellitus, type II (HCC)    Hypothyroidism     Past Surgical History:  Procedure Laterality Date   KNEE SURGERY     NOSE SURGERY      Social History   Socioeconomic History   Marital status: Single    Spouse name: Not on file   Number of children: Not on file   Years of education: Not on file   Highest education level: Not on file  Occupational History   Not on file  Tobacco Use   Smoking status: Every Day    Types: Cigarettes   Smokeless tobacco: Never  Vaping Use   Vaping Use: Never used  Substance and Sexual Activity   Alcohol use: Not Currently    Comment: sober 4-5 monyhd   Drug use: Never   Sexual activity: Not on file  Other Topics Concern   Not on file  Social History Narrative   Not on file   Social Determinants of Health   Financial Resource Strain: Not on file  Food Insecurity: Not on file  Transportation Needs: Not on file  Physical Activity: Not on file  Stress: Not on file  Social Connections: Not on file    Family History  Problem Relation Age of Onset   Cancer Mother    Thyroid disease Mother    Diabetes Mother    Cancer Father    Hypertension Father    Diabetes Father    Hyperlipidemia Father    Heart attack Father    Stroke Father     Outpatient Encounter Medications as of 08/28/2022  Medication Sig   albuterol (VENTOLIN HFA) 108 (90 Base) MCG/ACT inhaler Inhale 2 puffs every 4 hours by inhalation route.   Budeson-Glycopyrrol-Formoterol 160-9-4.8 MCG/ACT AERO INHALE 2 PUFFS BY ORAL INHALATION TWICE A DAY .  RINSE MOUTH AFTER EACH USE TO PREVENT ORAL THRUSH.   budesonide (PULMICORT) 0.5 MG/2ML nebulizer solution Inhale 2 mL twice a day by nebulization route for 90 days.   buPROPion (ZYBAN) 150 MG 12 hr tablet Take 300 mg by mouth daily.   cholecalciferol (VITAMIN D3) 25 MCG (1000 UNIT) tablet Take 1,000 Units by mouth QID.   hydrOXYzine (ATARAX) 25 MG tablet Take 25 mg by mouth 3 (three) times daily.   insulin glargine-yfgn (SEMGLEE) 100 UNIT/ML Pen Inject 20 Units into the skin at bedtime.   Insulin Pen Needle (PEN NEEDLES) 30G X 8 MM MISC Use to inject insulin  daily as directed.   levothyroxine (SYNTHROID) 150 MCG tablet Take 1 tablet (150 mcg total) by mouth daily before breakfast.   metFORMIN (GLUCOPHAGE) 1000 MG tablet Take 1,000 mg by mouth 2 (two) times daily with a meal.   naltrexone (DEPADE) 50 MG tablet Take 100 mg by mouth daily.   Omega-3 Fatty  Acids (FISH OIL OMEGA-3 PO) Take 2 capsules by mouth daily.   omeprazole (PRILOSEC) 40 MG capsule Take 1 capsule by mouth daily.   PARoxetine (PAXIL) 20 MG tablet Take 20 mg by mouth daily.   pregabalin (LYRICA) 200 MG capsule Take 200 mg by mouth 2 (two) times daily.   rosuvastatin (CRESTOR) 10 MG tablet Take 10 mg by mouth daily.   sildenafil (VIAGRA) 100 MG tablet Take 100 mg by mouth as needed.   traZODone (DESYREL) 50 MG tablet Take 50 mg by mouth at bedtime.   [DISCONTINUED] levothyroxine (SYNTHROID) 175 MCG tablet Take 175 mcg by mouth daily before breakfast.   No facility-administered encounter medications on file as of 08/28/2022.    ALLERGIES: No Known Allergies  VACCINATION STATUS: Immunization History  Administered Date(s) Administered   PFIZER(Purple Top)SARS-COV-2 Vaccination 04/10/2020    Diabetes He presents for his follow-up diabetic visit. He has type 2 diabetes mellitus. Onset time: He was diagnosed at the proximal range of 59 years. His disease course has been improving. There are no hypoglycemic associated symptoms.  Pertinent negatives for hypoglycemia include no confusion, headaches, pallor or seizures. Pertinent negatives for diabetes include no chest pain, no fatigue, no polydipsia, no polyphagia, no polyuria and no weakness. There are no hypoglycemic complications. Symptoms are improving. Risk factors for coronary artery disease include dyslipidemia, diabetes mellitus, family history, sedentary lifestyle, tobacco exposure and male sex. Current diabetic treatment includes oral agent (monotherapy). His weight is increasing steadily (Weight gain is appropriate response to his treatment since he will was adequate public state due to glucose toxicity prior to his last visit.). He is following a generally unhealthy diet. When asked about meal planning, he reported none. He has not had a previous visit with a dietitian. He rarely participates in exercise. His home blood glucose trend is decreasing steadily. His breakfast blood glucose range is generally 130-140 mg/dl. His bedtime blood glucose range is generally 140-180 mg/dl. His overall blood glucose range is 140-180 mg/dl. (Gives history of alcohol abuse/chronic use requiring alcohol detox in November 2023.  He presents with continued improvement in his glycemic profile.  His meter average is 149 for the last 14 days of 33 readings.  He did not document hypoglycemia.  His point-of-care A1c is 7%, overall improving from 9.7%   ) An ACE inhibitor/angiotensin II receptor blocker is not being taken.  Hyperlipidemia This is a chronic problem. The current episode started more than 1 year ago. The problem is controlled. Exacerbating diseases include diabetes. Pertinent negatives include no chest pain, myalgias or shortness of breath. Current antihyperlipidemic treatment includes statins. Risk factors for coronary artery disease include diabetes mellitus, dyslipidemia, male sex and a sedentary lifestyle.     Review of Systems  Constitutional:  Negative for chills, fatigue,  fever and unexpected weight change.  HENT:  Negative for dental problem, mouth sores and trouble swallowing.   Eyes:  Negative for visual disturbance.  Respiratory:  Negative for cough, choking, chest tightness, shortness of breath and wheezing.   Cardiovascular:  Negative for chest pain, palpitations and leg swelling.  Gastrointestinal:  Negative for abdominal distention, abdominal pain, constipation, diarrhea, nausea and vomiting.  Endocrine: Negative for polydipsia, polyphagia and polyuria.  Genitourinary:  Negative for dysuria, flank pain, hematuria and urgency.  Musculoskeletal:  Negative for back pain, gait problem, myalgias and neck pain.  Skin:  Negative for pallor, rash and wound.  Neurological:  Negative for seizures, syncope, weakness, numbness and headaches.  Psychiatric/Behavioral: Negative.  Negative  for confusion and dysphoric mood.     Objective:       08/28/2022   10:38 AM 08/28/2022   10:17 AM 07/28/2022    3:07 PM  Vitals with BMI  Height  5\' 10"  5\' 10"   Weight 200 lbs 10 oz 200 lbs 6 oz 196 lbs  BMI 28.78 28.75 28.12  Systolic  108   Diastolic  66   Pulse  72     BP 108/66   Pulse 72   Ht 5\' 10"  (1.778 m)   Wt 200 lb 6.4 oz (90.9 kg)   BMI 28.75 kg/m   Wt Readings from Last 3 Encounters:  08/28/22 200 lb 6.4 oz (90.9 kg)  08/28/22 200 lb 9.6 oz (91 kg)  07/28/22 196 lb (88.9 kg)      CMP ( most recent) CMP     Component Value Date/Time   NA 141 08/19/2022 1553   K 5.2 08/19/2022 1553   CL 100 08/19/2022 1553   CO2 23 08/19/2022 1553   GLUCOSE 124 (H) 08/19/2022 1553   GLUCOSE 86 08/26/2009 1925   BUN 16 08/19/2022 1553   CREATININE 0.84 08/19/2022 1553   CALCIUM 9.6 08/19/2022 1553   PROT 6.9 08/19/2022 1553   ALBUMIN 4.7 08/19/2022 1553   AST 12 08/19/2022 1553   ALT 15 08/19/2022 1553   ALKPHOS 75 08/19/2022 1553   BILITOT 0.4 08/19/2022 1553   GFRNONAA >60 08/26/2009 1925   GFRAA  08/26/2009 1925    >60        The eGFR has been  calculated using the MDRD equation. This calculation has not been validated in all clinical situations. eGFR's persistently <60 mL/min signify possible Chronic Kidney Disease.  Lipid Panel     Component Value Date/Time   CHOL 130 08/19/2022 1553   TRIG 79 08/19/2022 1553   HDL 61 08/19/2022 1553   CHOLHDL 2.1 08/19/2022 1553   LDLCALC 53 08/19/2022 1553   LABVLDL 16 08/19/2022 1553      Assessment & Plan:   1. 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 Gives history of alcohol abuse/chronic use requiring alcohol detox in November 2023.  He presents with continued improvement in his glycemic profile.  His meter average is 149 for the last 14 days of 33 readings.  He did not document hypoglycemia.  His point-of-care A1c is 7%, overall improving from 9.7%  - I had a long discussion with him about the possible risk factors and  the pathology behind its diabetes and its complications. 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 These are all discussed in detail with him.  - I discussed all available options of managing his diabetes including de-escalation of medications. I have counseled him on Food as Medicine by adopting a Whole Food , Plant Predominant  ( WFPP) nutrition as recommended by [employer] of Lifestyle Medicine. Patient is encouraged to switch to  unprocessed or minimally processed  complex starch, adequate protein intake (mainly plant source), minimal liquid fat, plenty of fruits, and vegetables. -  he is advised to stick to a routine mealtimes to eat 3 complete meals a day and snack only when necessary ( to  snack  only to correct hypoglycemia BG <70 day time or <100 at night).   - he acknowledges that there is a room for improvement in his food and drink choices. - Suggestion is made for him to avoid simple carbohydrates  from his diet including Cakes, Sweet Desserts, Ice Cream, Soda (diet and regular), Sweet Tea, Candies, Chips, Cookies, Store Bought Juices, Alcohol , Artificial Sweeteners,  Coffee Creamer, and "Sugar-free" Products, Lemonade. This will help patient to have more stable blood glucose profile and potentially avoid unintended weight gain.  The following Lifestyle Medicine recommendations according to American College of Lifestyle Medicine  [facility]) were discussed and and offered to patient and he  agrees to start the journey:  A. Whole Foods, Plant-Based Nutrition comprising of fruits and vegetables, plant-based proteins, whole-grain carbohydrates was discussed in detail with the patient.   A list for source of those nutrients were also provided to the patient.  Patient will use only water or unsweetened tea for hydration. B.  The need to stay away from risky substances including alcohol, smoking; obtaining 7 to 9 hours of restorative sleep, at least 150 minutes of moderate intensity exercise weekly, the importance of healthy social connections,  and stress management techniques were discussed. C.  A full color page of  Calorie density of various food groups per pound showing examples of each food groups was provided to the patient.   - he will be scheduled with Norm Salt, RDN, CDE for individualized diabetes education.  - I have approached him with the following individualized plan to manage  his diabetes and patient agrees:  Given his history of heavy alcohol history, this patient will likely end up requiring basal/bolus insulin to control his glycemia to target.  However, at this time he is responding adequately to his basal insulin only.  -He is advised to continue Semglee 20 units  nightly, advised to continue metformin 1000 mg p.o. twice daily.  He is willing and encouraged to continue monitoring blood glucose at least twice a day-daily before breakfast and at bedtime.   - he is encouraged to call clinic for blood glucose levels less than 70 or above 300 mg /dl. He is not a suitable candidate for DPP 4 inhibitors nor GLP-1 receptor agonist due to risk of pancreatitis.  - Specific targets for  A1c;  LDL, HDL,  and Triglycerides were discussed with the patient.  2) Blood Pressure /Hypertension:   -His blood pressure is controlled started.  He is not on antihypertensive medications, may not tolerate.  3) Lipids/Hyperlipidemia: Review of his recent lipid panel showed controlled LDL at 53.  He is advised to continue Crestor 10 mg p.o. daily at bedtime.   Side effects and precautions discussed with him.  4)  Weight/Diet:  Body mass index is 28.75 kg/m.  -    he is not a candidate for weight loss. I discussed with him the fact that loss of 5 - 10% of his  current body weight will have the most impact on his diabetes management.  The above detailed  ACLM recommendations for nutrition, exercise, sleep, social life, avoidance of risky substances, the need for restorative sleep   information will also detailed on discharge instructions.  5) hypothyroidism: The circumstance of his diagnosis are not available to review.  His previsit thyroid function tests are consistent with over replacement.  I discussed and lowered his levothyroxine to 150 mcg p.o. daily before breakfast.     - We discussed about the  correct intake of his thyroid hormone, on empty stomach at fasting, with water, separated by at least 30 minutes from breakfast and other medications,  and separated by more than 4 hours from calcium, iron, multivitamins, acid reflux medications (PPIs). -Patient is made aware of the fact that thyroid hormone replacement is needed for life, dose to be adjusted by periodic monitoring of  thyroid function tests.   6) Chronic Care/Health Maintenance:  -he  is on  Statin medications , will not tolerate ACE inhibitor's and  is encouraged to initiate and continue to follow up with Ophthalmology, Dentist,  Podiatrist at least yearly or according to recommendations, and advised to  quit smoking. I have recommended yearly flu vaccine and pneumonia vaccine at least every 5 years; moderate intensity exercise for up to 150 minutes weekly; and  sleep for 7- 9 hours a day.  He is trying to achieve smoking cessation, currently on nicotine patch.  The patient was counseled on the dangers of tobacco use, and was advised to quit.  Reviewed strategies to maximize success, including removing cigarettes and smoking materials from environment. He also reports that he did not drink alcohol for 4 weeks now.  - he is  advised to maintain close follow up with Damaris Schooner for primary care needs, as well as his other providers for optimal and coordinated care.    Follow up plan: - Return in about 4 months (around 12/28/2022) for Bring Meter/CGM Device/Logs- A1c in Office, Urine MA - [place].  Marquis Lunch, MD [facility] [address] [place], [place] [address] Phone: [phone]  Fax: [phone]    08/28/2022, 8:22 PM  This note was partially dictated with voice recognition software. Similar sounding words can be transcribed inadequately or may not  be corrected upon review.

## 2022-08-28 NOTE — Patient Instructions (Signed)

## 2022-08-28 NOTE — Progress Notes (Signed)
Medical Nutrition Therapy  Appointment Start time:  1030  Appointment End time:  1100  Primary concerns today: DM Dm Type 2   Referral diagnosis: E11. Preferred learning style: NO Preference. Learning readiness: Change in progress.   NUTRITION ASSESSMENT  DM Follow up. A1C 7.0%.   Changes made: Eating more vegetables, eating more lean meats. Working on  Quit smoking last week. Semglee 20 units at night, Metformin 1000 mg BID. FBS 194  Saw pulmonologist this am. Is on 3 L continuous n oxygen   Still smoking but working on cutting down. Anthropometrics  Wt Readings from Last 3 Encounters:  07/28/22 196 lb (88.9 kg)  05/28/22 186 lb 6.4 oz (84.6 kg)  05/13/22 181 lb 9.6 oz (82.4 kg)   Ht Readings from Last 3 Encounters:  07/28/22 5\' 10"  (1.778 m)  05/28/22 5\' 10"  (1.778 m)  05/13/22 5\' 10"  (1.778 m)   There is no height or weight on file to calculate BMI. @BMIFA @ Facility age limit for growth %iles is 20 years. Facility age limit for growth %iles is 20 years.    Clinical Medical Hx: See chart Medications: Semglee, Metformin Labs: will get A1C done next month Notable Signs/Symptoms: none  Lifestyle & Dietary Hx LIves with girlfriend. He is out of work right now. Is a Games developer.  Estimated daily fluid intake: 60 oz Supplements: VIt D Sleep:  Stress / self-care:  Current average weekly physical activity: ADL  24-Hr Dietary Recall First Meal: Oatmeal, old fashion, water Snack:  Second Meal: Ham and cheese sandwich on rye bread, water Snack:  Third Meal:Steak, potatoes, butter beans, water Snack Pecan pie   Beverages: water  Estimated Energy Needs Calories: 1800-2000 Carbohydrate: 225g Protein: 150g Fat: 56g   NUTRITION DIAGNOSIS  NB-1.1 Food and nutrition-related knowledge deficit As related to Diabetes Type 2.  As evidenced by A1C >13% with pancreatitis.   NUTRITION INTERVENTION  Nutrition education (E-1) on the following topics:  Nutrition  and Diabetes education provided on My Plate, CHO counting, meal planning, portion sizes, timing of meals, avoiding snacks between meals unless having a low blood sugar, target ranges for A1C and blood sugars, signs/symptoms and treatment of hyper/hypoglycemia, monitoring blood sugars, taking medications as prescribed, benefits of exercising 30 minutes per day and prevention of complications of DM.  Lifestyle Medicine  - Whole Food, Plant Predominant Nutrition is highly recommended: Eat Plenty of vegetables, Mushrooms, fruits, Legumes, Whole Grains, Nuts, seeds in lieu of processed meats, processed snacks/pastries red meat, poultry, eggs.    -It is better to avoid simple carbohydrates including: Cakes, Sweet Desserts, Ice Cream, Soda (diet and regular), Sweet Tea, Candies, Chips, Cookies, Store Bought Juices, Alcohol in Excess of  1-2 drinks a day, Lemonade,  Artificial Sweeteners, Doughnuts, Coffee Creamers, "Sugar-free" Products, etc, etc.  This is not a complete list.....  Exercise: If you are able: 30 -60 minutes a day ,4 days a week, or 150 minutes a week.  The longer the better.  Combine stretch, strength, and aerobic activities.  If you were told in the past that you have high risk for cardiovascular diseases, you may seek evaluation by your heart doctor prior to initiating moderate to intense exercise programs.   Handouts Provided Include  Lifestyle Medicine Know your numbers   Learning Style & Readiness for Change Teaching method utilized: Visual & Auditory  Demonstrated degree of understanding via: Teach Back  Barriers to learning/adherence to lifestyle change: none  Goals Established by Pt Goals.  Work on cutting  down on sweet/desserts Get A1C down 6.5%.Marland Kitchen   MONITORING & EVALUATION Dietary intake, weekly physical activity, and blood work in 3 month.  Next Steps  Patient is to work on meal planning and increasing high fiber foods.Marland Kitchen

## 2022-08-28 NOTE — Patient Instructions (Signed)
Goals.  Work on cutting down on sweet/desserts Get A1C down 6.5%.

## 2022-11-10 ENCOUNTER — Telehealth: Payer: Self-pay | Admitting: "Endocrinology

## 2022-11-10 MED ORDER — INSULIN GLARGINE-YFGN 100 UNIT/ML ~~LOC~~ SOPN: 20.0000 [IU] | PEN_INJECTOR | Freq: Every day | SUBCUTANEOUS | 3 refills | Status: DC

## 2022-11-10 NOTE — Telephone Encounter (Signed)
Pt left a VM he needs a refill on his  insulin glargine-yfgn (SEMGLEE) 100 UNIT/ML Pen to be sent to [facility]. Thank you

## 2022-11-10 NOTE — Telephone Encounter (Signed)
Rx sent 

## 2022-12-12 ENCOUNTER — Telehealth: Payer: Self-pay | Admitting: "Endocrinology

## 2022-12-12 NOTE — Telephone Encounter (Signed)
Received medical records request from [facility] [place]. I have faxed this request to [employer] to release records. If patient wants to follow up he can call them at [phone]

## 2022-12-29 ENCOUNTER — Ambulatory Visit (INDEPENDENT_AMBULATORY_CARE_PROVIDER_SITE_OTHER): Payer: No Typology Code available for payment source | Admitting: "Endocrinology

## 2022-12-29 ENCOUNTER — Encounter: Payer: Self-pay | Admitting: "Endocrinology

## 2022-12-29 VITALS — BP 136/78 | HR 72 | Ht 70.0 in | Wt 201.2 lb

## 2022-12-29 DIAGNOSIS — E1165 Type 2 diabetes mellitus with hyperglycemia: Secondary | ICD-10-CM

## 2022-12-29 DIAGNOSIS — E039 Hypothyroidism, unspecified: Secondary | ICD-10-CM

## 2022-12-29 DIAGNOSIS — I10 Essential (primary) hypertension: Secondary | ICD-10-CM

## 2022-12-29 DIAGNOSIS — Z794 Long term (current) use of insulin: Secondary | ICD-10-CM

## 2022-12-29 DIAGNOSIS — E782 Mixed hyperlipidemia: Secondary | ICD-10-CM

## 2022-12-29 LAB — POCT UA - MICROALBUMIN
Creatinine, POC: 300 mg/dL
Microalbumin Ur, POC: 80 mg/L

## 2022-12-29 LAB — POCT GLYCOSYLATED HEMOGLOBIN (HGB A1C): HbA1c, POC (controlled diabetic range): 7.7 % — AB (ref 0.0–7.0)

## 2022-12-29 MED ORDER — LEVOTHYROXINE SODIUM 150 MCG PO TABS: 150.0000 ug | ORAL_TABLET | Freq: Every day | ORAL | 1 refills | Status: DC

## 2022-12-29 MED ORDER — INSULIN GLARGINE-YFGN 100 UNIT/ML ~~LOC~~ SOPN: 30.0000 [IU] | PEN_INJECTOR | Freq: Every day | SUBCUTANEOUS | 3 refills | Status: DC

## 2022-12-29 NOTE — Addendum Note (Signed)
Addended by: Derrell Lolling on: 12/29/2022 02:44 PM   Modules accepted: Orders

## 2022-12-29 NOTE — Progress Notes (Signed)
Endocrinology follow-up note       12/29/2022, 2:32 PM   Subjective:    Patient ID: Colin Austin, male    DOB: 11/18/1960.  Colin Austin is being seen in follow-up after he was sitting consultation for management of currently uncontrolled symptomatic diabetes requested by  Damaris Schooner, MD.   Past Medical History:  Diagnosis Date   Diabetes mellitus, type II (HCC)    Hypothyroidism     Past Surgical History:  Procedure Laterality Date   KNEE SURGERY     NOSE SURGERY      Social History   Socioeconomic History   Marital status: Single    Spouse name: Not on file   Number of children: Not on file   Years of education: Not on file   Highest education level: Not on file  Occupational History   Not on file  Tobacco Use   Smoking status: Every Day    Types: Cigarettes   Smokeless tobacco: Never  Vaping Use   Vaping status: Never Used  Substance and Sexual Activity   Alcohol use: Not Currently    Comment: sober 4-5 monyhd   Drug use: Never   Sexual activity: Not on file  Other Topics Concern   Not on file  Social History Narrative   Not on file   Social Determinants of Health   Financial Resource Strain: Low Risk  (08/27/2021)   Received from [facility], [facility]   Overall Financial Resource Strain (CARDIA)    Difficulty of Paying Living Expenses: Not hard at all  Food Insecurity: No Food Insecurity (08/27/2021)   Received from [facility], [facility]   Hunger Vital Sign    Worried About Running Out of Food in the Last Year: Never true    Ran Out of Food in the Last Year: Never true  Transportation Needs: No Transportation Needs (08/27/2021)   Received from [facility], [facility]   [facility] - Transportation    Lack of Transportation (Medical): No    Lack of Transportation (Non-Medical): No  Physical Activity: Not on file  Stress: Not on file   Social Connections: Not on file    Family History  Problem Relation Age of Onset   Cancer Mother    Thyroid disease Mother    Diabetes Mother    Cancer Father    Hypertension Father    Diabetes Father    Hyperlipidemia Father    Heart attack Father    Stroke Father     Outpatient Encounter Medications as of 12/29/2022  Medication Sig   albuterol (VENTOLIN HFA) 108 (90 Base) MCG/ACT inhaler Inhale 2 puffs every 4 hours by inhalation route.   Budeson-Glycopyrrol-Formoterol 160-9-4.8 MCG/ACT AERO INHALE 2 PUFFS BY ORAL INHALATION TWICE A DAY . RINSE MOUTH AFTER EACH USE TO PREVENT ORAL THRUSH.   budesonide (PULMICORT) 0.5 MG/2ML nebulizer solution Inhale 2 mL twice a day by nebulization route for 90 days.   buPROPion (ZYBAN) 150 MG 12 hr tablet Take 300 mg by mouth daily.   cholecalciferol (VITAMIN D3) 25 MCG (1000 UNIT) tablet Take 1,000 Units by mouth  QID.   hydrOXYzine (ATARAX) 25 MG tablet Take 25 mg by mouth 3 (three) times daily.   insulin glargine-yfgn (SEMGLEE) 100 UNIT/ML Pen Inject 30 Units into the skin at bedtime.   Insulin Pen Needle (PEN NEEDLES) 30G X 8 MM MISC Use to inject insulin  daily as directed.   levothyroxine (SYNTHROID) 150 MCG tablet Take 1 tablet (150 mcg total) by mouth daily before breakfast.   metFORMIN (GLUCOPHAGE) 1000 MG tablet Take 1,000 mg by mouth 2 (two) times daily with a meal.   naltrexone (DEPADE) 50 MG tablet Take 100 mg by mouth daily.   Omega-3 Fatty Acids (FISH OIL OMEGA-3 PO) Take 2 capsules by mouth daily.   omeprazole (PRILOSEC) 40 MG capsule Take 1 capsule by mouth daily.   PARoxetine (PAXIL) 20 MG tablet Take 20 mg by mouth daily.   pregabalin (LYRICA) 200 MG capsule Take 200 mg by mouth 2 (two) times daily.   rosuvastatin (CRESTOR) 10 MG tablet Take 10 mg by mouth daily.   sildenafil (VIAGRA) 100 MG tablet Take 100 mg by mouth as needed.   traZODone (DESYREL) 50 MG tablet Take 50 mg by mouth at bedtime.   [DISCONTINUED] insulin  glargine-yfgn (SEMGLEE) 100 UNIT/ML Pen Inject 20 Units into the skin at bedtime. (Patient taking differently: Inject 20-30 Units into the skin at bedtime.)   [DISCONTINUED] levothyroxine (SYNTHROID) 150 MCG tablet Take 1 tablet (150 mcg total) by mouth daily before breakfast.   No facility-administered encounter medications on file as of 12/29/2022.    ALLERGIES: No Known Allergies  VACCINATION STATUS: Immunization History  Administered Date(s) Administered   PFIZER(Purple Top)SARS-COV-2 Vaccination 04/10/2020    Diabetes He presents for his follow-up diabetic visit. He has type 2 diabetes mellitus. Onset time: He was diagnosed at the proximal range of 62 years. His disease course has been worsening. There are no hypoglycemic associated symptoms. Pertinent negatives for hypoglycemia include no confusion, headaches, pallor or seizures. Pertinent negatives for diabetes include no chest pain, no fatigue, no polydipsia, no polyphagia, no polyuria and no weakness. There are no hypoglycemic complications. Symptoms are worsening. Risk factors for coronary artery disease include dyslipidemia, diabetes mellitus, family history, sedentary lifestyle, tobacco exposure and male sex. Current diabetic treatment includes oral agent (monotherapy). His weight is increasing steadily (Weight gain is appropriate response to his treatment since he will was adequate public state due to glucose toxicity prior to his last visit.). He is following a generally unhealthy diet. When asked about meal planning, he reported none. He has not had a previous visit with a dietitian. He rarely participates in exercise. His home blood glucose trend is increasing steadily. His breakfast blood glucose range is generally 140-180 mg/dl. His bedtime blood glucose range is generally 140-180 mg/dl. His overall blood glucose range is 140-180 mg/dl. (Gives history of alcohol abuse/chronic use requiring alcohol detox in November 2023.  He presents  with above target glycemic profile.  He is not regularly Qsymia to 149-168 over the last 30 days.  His point-of-care A1c 7.7% increasing from 7%.     ) An ACE inhibitor/angiotensin II receptor blocker is not being taken.  Hyperlipidemia This is a chronic problem. The current episode started more than 1 year ago. The problem is controlled. Exacerbating diseases include diabetes. Pertinent negatives include no chest pain, myalgias or shortness of breath. Current antihyperlipidemic treatment includes statins. Risk factors for coronary artery disease include diabetes mellitus, dyslipidemia, male sex and a sedentary lifestyle.     Review of Systems  Constitutional:  Negative for chills, fatigue, fever and unexpected weight change.  HENT:  Negative for dental problem, mouth sores and trouble swallowing.   Eyes:  Negative for visual disturbance.  Respiratory:  Negative for cough, choking, chest tightness, shortness of breath and wheezing.   Cardiovascular:  Negative for chest pain, palpitations and leg swelling.  Gastrointestinal:  Negative for abdominal distention, abdominal pain, constipation, diarrhea, nausea and vomiting.  Endocrine: Negative for polydipsia, polyphagia and polyuria.  Genitourinary:  Negative for dysuria, flank pain, hematuria and urgency.  Musculoskeletal:  Negative for back pain, gait problem, myalgias and neck pain.  Skin:  Negative for pallor, rash and wound.  Neurological:  Negative for seizures, syncope, weakness, numbness and headaches.  Psychiatric/Behavioral: Negative.  Negative for confusion and dysphoric mood.     Objective:       12/29/2022    2:01 PM 08/28/2022   10:38 AM 08/28/2022   10:17 AM  Vitals with BMI  Height 5\' 10"   5\' 10"   Weight 201 lbs 3 oz 200 lbs 10 oz 200 lbs 6 oz  BMI 28.87 28.78 28.75  Systolic 136  108  Diastolic 78  66  Pulse 72  72    BP 136/78   Pulse 72   Ht 5\' 10"  (1.778 m)   Wt 201 lb 3.2 oz (91.3 kg)   BMI 28.87 kg/m   Wt  Readings from Last 3 Encounters:  12/29/22 201 lb 3.2 oz (91.3 kg)  08/28/22 200 lb 6.4 oz (90.9 kg)  08/28/22 200 lb 9.6 oz (91 kg)      CMP ( most recent) CMP     Component Value Date/Time   NA 141 08/19/2022 1553   K 5.2 08/19/2022 1553   CL 100 08/19/2022 1553   CO2 23 08/19/2022 1553   GLUCOSE 124 (H) 08/19/2022 1553   GLUCOSE 86 08/26/2009 1925   BUN 16 08/19/2022 1553   CREATININE 0.84 08/19/2022 1553   CALCIUM 9.6 08/19/2022 1553   PROT 6.9 08/19/2022 1553   ALBUMIN 4.7 08/19/2022 1553   AST 12 08/19/2022 1553   ALT 15 08/19/2022 1553   ALKPHOS 75 08/19/2022 1553   BILITOT 0.4 08/19/2022 1553   GFRNONAA >60 08/26/2009 1925   GFRAA  08/26/2009 1925    >60        The eGFR has been calculated using the MDRD equation. This calculation has not been validated in all clinical situations. eGFR's persistently <60 mL/min signify possible Chronic Kidney Disease.  Lipid Panel     Component Value Date/Time   CHOL 130 08/19/2022 1553   TRIG 79 08/19/2022 1553   HDL 61 08/19/2022 1553   CHOLHDL 2.1 08/19/2022 1553   LDLCALC 53 08/19/2022 1553   LABVLDL 16 08/19/2022 1553      Assessment & Plan:   1. 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  Gives history of alcohol abuse/chronic use requiring alcohol detox in November 2023.  He presents with above target glycemic profile.  He is not regularly Qsymia to 149-168 over the last 30 days.  His point-of-care A1c 7.7% increasing from 7%.    - I had a long discussion with him about the possible risk factors and  the pathology behind its diabetes and its complications. -his diabetes is complicated by alcoholism, chronic smoking, erectile dysfunction, sedentary life and  he remains at a high risk for more acute and chronic complications which include  CAD, CVA, CKD, retinopathy, and neuropathy. These are all discussed in detail with him.  - I discussed all available options of managing his diabetes including de-escalation of medications. I have counseled him on Food as Medicine by adopting a Whole Food , Plant Predominant  ( WFPP) nutrition as recommended by [employer] of Lifestyle Medicine. Patient is encouraged to switch to  unprocessed or minimally processed  complex starch, adequate protein intake (mainly plant source), minimal liquid fat, plenty of fruits, and vegetables. -  he is advised to stick to a routine mealtimes to eat 3 complete meals a day and snack only when necessary ( to snack only to correct hypoglycemia BG <70 day time or <100 at night).   - he acknowledges that there is a room for improvement in his food and drink choices. - Suggestion is made for him to avoid simple carbohydrates  from his diet including Cakes, Sweet Desserts, Ice Cream, Soda (diet and regular), Sweet Tea, Candies, Chips, Cookies, Store Bought Juices, Alcohol , Artificial Sweeteners,  Coffee Creamer, and "Sugar-free" Products, Lemonade. This will help patient to have more stable blood glucose profile and potentially avoid unintended weight gain.  The following Lifestyle Medicine recommendations according to American College of Lifestyle Medicine  [facility]) were discussed and and offered to patient and he  agrees to start the journey:  A. Whole Foods, Plant-Based Nutrition comprising of fruits and vegetables, plant-based proteins, whole-grain carbohydrates was discussed in detail with the patient.   A list for source of those nutrients were also provided to the patient.  Patient will use only water or unsweetened tea for hydration. B.  The need to stay away from risky substances including alcohol, smoking; obtaining 7 to 9 hours of restorative sleep, at least 150 minutes of moderate intensity exercise weekly, the importance of healthy social connections,  and  stress management techniques were discussed. C.  A full color page of  Calorie density of various food groups per pound showing examples of each food groups was provided to the patient.  - he will be scheduled with Norm Salt, RDN, CDE for individualized diabetes education.  - I have approached him with the following individualized plan to manage  his diabetes and patient agrees:  Given his history of heavy alcohol history, this patient will likely end up requiring basal/bolus insulin to control his glycemia to target.   -However, he still responds to basal insulin only.  I discussed and advised him to increase his Semglee consistently to 30 units nightly, associated with monitoring of blood glucose twice a day-daily before breakfast and at bedtime.    - he is encouraged to call clinic for blood glucose levels less than 70 or above 300 mg /dl. -He is advised to continue metformin 1000 mg p.o. twice daily. -If he returns with his antidepressant profile, he will be considered for low-dose SGLT2 inhibitors.  He does have urine microalbuminuria today. He is not a suitable candidate for DPP 4 inhibitors nor GLP-1 receptor agonist due to risk of pancreatitis.  - Specific targets for  A1c;  LDL, HDL,  and Triglycerides were discussed with the patient.  2) Blood Pressure /Hypertension:   -His blood pressure is controlled to target.  He is not on antihypertensive medications, may not tolerate.  3) Lipids/Hyperlipidemia: Review of his recent lipid panel showed controlled LDL at 53.  He is advised to continue Crestor 10  mg p.o. daily at bedtime.   Side effects and precautions discussed with him.  4)  Weight/Diet:  Body mass index is 28.87 kg/m.  -    he is not a candidate for weight loss. I discussed with him the fact that loss of 5 - 10% of his  current body weight will have the most impact on his diabetes management.  The above detailed  ACLM recommendations for nutrition, exercise, sleep, social  life, avoidance of risky substances, the need for restorative sleep   information will also detailed on discharge instructions.  5) hypothyroidism: The circumstance of his diagnosis are not available to review.  He did not have previsit thyroid function test.  He is advised to continue levothyroxine 150 mcg p.o. daily before breakfast.     - We discussed about the correct intake of his thyroid hormone, on empty stomach at fasting, with water, separated by at least 30 minutes from breakfast and other medications,  and separated by more than 4 hours from calcium, iron, multivitamins, acid reflux medications (PPIs). -Patient is made aware of the fact that thyroid hormone replacement is needed for life, dose to be adjusted by periodic monitoring of thyroid function tests.   6) Chronic Care/Health Maintenance:  -he  is on  Statin medications , will not tolerate ACE inhibitor's and  is encouraged to initiate and continue to follow up with Ophthalmology, Dentist,  Podiatrist at least yearly or according to recommendations, and advised to  quit smoking. I have recommended yearly flu vaccine and pneumonia vaccine at least every 5 years; moderate intensity exercise for up to 150 minutes weekly; and  sleep for 7- 9 hours a day.  He is trying to achieve smoking cessation, currently on nicotine patch.  The patient was counseled on the dangers of tobacco use, and was advised to quit.  Reviewed strategies to maximize success, including removing cigarettes and smoking materials from environment. He also reports that he did not drink alcohol for 4 weeks now.  - he is  advised to maintain close follow up with Damaris Schooner, MD for primary care needs, as well as his other providers for optimal and coordinated care.    I spent  25  minutes in the care of the patient today including review of labs from CMP, Lipids, Thyroid Function, Hematology (current and previous including abstractions from other facilities);  face-to-face time discussing  his blood glucose readings/logs, discussing hypoglycemia and hyperglycemia episodes and symptoms, medications doses, his options of short and long term treatment based on the latest standards of care / guidelines;  discussion about incorporating lifestyle medicine;  and documenting the encounter. Risk reduction counseling performed per USPSTF guidelines to reduce cardiovascular risk factors.     Please refer to Patient Instructions for Blood Glucose Monitoring and Insulin/Medications Dosing Guide"  in media tab for additional information. Please  also refer to " Patient Self Inventory" in the Media  tab for reviewed elements of pertinent patient history.  Colin Austin participated in the discussions, expressed understanding, and voiced agreement with the above plans.  All questions were answered to his satisfaction. he is encouraged to contact clinic should he have any questions or concerns prior to his return visit.   Follow up plan: - Return in about 3 months (around 03/31/2023) for F/U with Pre-visit Labs, Meter/CGM/Logs, A1c here.  Marquis Lunch, MD [facility] [address] [place], [place] [address] Phone: [phone]  Fax: [phone]    12/29/2022,  2:32 PM  This note was partially dictated with voice recognition software. Similar sounding words can be transcribed inadequately or may not  be corrected upon review.

## 2022-12-29 NOTE — Patient Instructions (Signed)

## 2023-01-26 ENCOUNTER — Telehealth: Payer: Self-pay | Admitting: "Endocrinology

## 2023-01-26 DIAGNOSIS — Z794 Long term (current) use of insulin: Secondary | ICD-10-CM

## 2023-01-26 NOTE — Telephone Encounter (Addendum)
Pt is requesting a refill on his Metformin & Accu Chek Lancets to the [facility] He said they keep sending him safety needles for his insulin, as he said he does not like them, they leave bruises on his stomach, so he is asking to have needles sent in but please put not " safety needles "

## 2023-01-27 MED ORDER — ACCU-CHEK SOFTCLIX LANCETS MISC
2 refills | Status: AC
Start: 2023-01-27 — End: ?

## 2023-01-27 MED ORDER — PEN NEEDLES 30G X 8 MM MISC: 2 refills | Status: AC

## 2023-01-27 MED ORDER — METFORMIN HCL 1000 MG PO TABS: 1000.0000 mg | ORAL_TABLET | Freq: Two times a day (BID) | ORAL | 0 refills | Status: AC

## 2023-01-27 NOTE — Telephone Encounter (Signed)
Rx refills for metformin, accu-chek lancets and needles with the notation of no safety needles requested sent to [facility].

## 2023-01-29 ENCOUNTER — Encounter: Payer: Self-pay | Admitting: Nutrition

## 2023-02-23 ENCOUNTER — Telehealth: Payer: Self-pay | Admitting: "Endocrinology

## 2023-02-23 NOTE — Telephone Encounter (Signed)
Pt had called the after hours line on 02/22/23 with complaints of high BG readings. Called pt. No answer. Lft msg

## 2023-04-09 LAB — COMPREHENSIVE METABOLIC PANEL
ALT: 27 [IU]/L (ref 0–44)
AST: 23 [IU]/L (ref 0–40)
Albumin: 4.5 g/dL (ref 3.9–4.9)
Alkaline Phosphatase: 101 [IU]/L (ref 44–121)
BUN/Creatinine Ratio: 18 (ref 10–24)
BUN: 15 mg/dL (ref 8–27)
Bilirubin Total: 0.5 mg/dL (ref 0.0–1.2)
CO2: 28 mmol/L (ref 20–29)
Calcium: 9.6 mg/dL (ref 8.6–10.2)
Chloride: 100 mmol/L (ref 96–106)
Creatinine, Ser: 0.84 mg/dL (ref 0.76–1.27)
Globulin, Total: 2.3 g/dL (ref 1.5–4.5)
Glucose: 149 mg/dL — ABNORMAL HIGH (ref 70–99)
Potassium: 5.5 mmol/L — ABNORMAL HIGH (ref 3.5–5.2)
Sodium: 140 mmol/L (ref 134–144)
Total Protein: 6.8 g/dL (ref 6.0–8.5)
eGFR: 99 mL/min/{1.73_m2} (ref >59)

## 2023-04-09 LAB — LIPID PANEL
Chol/HDL Ratio: 3.2 {ratio} (ref 0.0–5.0)
Cholesterol, Total: 159 mg/dL (ref 100–199)
HDL: 49 mg/dL (ref >39)
LDL Chol Calc (NIH): 88 mg/dL (ref 0–99)
Triglycerides: 121 mg/dL (ref 0–149)
VLDL Cholesterol Cal: 22 mg/dL (ref 5–40)

## 2023-04-09 LAB — TSH: TSH: 0.24 u[IU]/mL — ABNORMAL LOW (ref 0.450–4.500)

## 2023-04-09 LAB — T4, FREE: Free T4: 1.45 ng/dL (ref 0.82–1.77)

## 2023-04-13 ENCOUNTER — Ambulatory Visit (INDEPENDENT_AMBULATORY_CARE_PROVIDER_SITE_OTHER): Payer: No Typology Code available for payment source | Admitting: "Endocrinology

## 2023-04-13 ENCOUNTER — Encounter: Payer: Self-pay | Admitting: "Endocrinology

## 2023-04-13 VITALS — BP 130/78 | HR 96 | Ht 70.0 in | Wt 199.6 lb

## 2023-04-13 DIAGNOSIS — I10 Essential (primary) hypertension: Secondary | ICD-10-CM | POA: Diagnosis not present

## 2023-04-13 DIAGNOSIS — E039 Hypothyroidism, unspecified: Secondary | ICD-10-CM

## 2023-04-13 DIAGNOSIS — E1165 Type 2 diabetes mellitus with hyperglycemia: Secondary | ICD-10-CM

## 2023-04-13 DIAGNOSIS — Z794 Long term (current) use of insulin: Secondary | ICD-10-CM

## 2023-04-13 DIAGNOSIS — E782 Mixed hyperlipidemia: Secondary | ICD-10-CM

## 2023-04-13 LAB — POCT GLYCOSYLATED HEMOGLOBIN (HGB A1C): HbA1c, POC (controlled diabetic range): 8.3 % — AB (ref 0.0–7.0)

## 2023-04-13 MED ORDER — LEVOTHYROXINE SODIUM 137 MCG PO TABS: 137.0000 ug | ORAL_TABLET | Freq: Every day | ORAL | 1 refills | Status: DC

## 2023-04-13 NOTE — Progress Notes (Signed)
Endocrinology follow-up note       04/13/2023, 2:22 PM   Subjective:    Patient ID: Colin Austin, male    DOB: [birth year].  Colin Austin is being seen in follow-up after he was sitting consultation for management of currently uncontrolled symptomatic diabetes requested by  Damaris Schooner, MD.   Past Medical History:  Diagnosis Date   Diabetes mellitus, type II (HCC)    Hypothyroidism     Past Surgical History:  Procedure Laterality Date   KNEE SURGERY     NOSE SURGERY      Social History   Socioeconomic History   Marital status: Single    Spouse name: Not on file   Number of children: Not on file   Years of education: Not on file   Highest education level: Not on file  Occupational History   Not on file  Tobacco Use   Smoking status: Every Day    Types: Cigarettes   Smokeless tobacco: Never  Vaping Use   Vaping status: Never Used  Substance and Sexual Activity   Alcohol use: Not Currently    Comment: sober 4-5 monyhd   Drug use: Never   Sexual activity: Not on file  Other Topics Concern   Not on file  Social History Narrative   Not on file   Social Determinants of Health   Financial Resource Strain: Low Risk  (08/27/2021)   Received from [facility], [facility]   Overall Financial Resource Strain (CARDIA)    Difficulty of Paying Living Expenses: Not hard at all  Food Insecurity: No Food Insecurity (08/27/2021)   Received from [facility], [facility]   Hunger Vital Sign    Worried About Running Out of Food in the Last Year: Never true    Ran Out of Food in the Last Year: Never true  Transportation Needs: No Transportation Needs (08/27/2021)   Received from [facility], [facility]   [facility] - Transportation    Lack of Transportation (Medical): No    Lack of Transportation (Non-Medical): No  Physical Activity: Not on file  Stress: Not on file   Social Connections: Not on file    Family History  Problem Relation Age of Onset   Cancer Mother    Thyroid disease Mother    Diabetes Mother    Cancer Father    Hypertension Father    Diabetes Father    Hyperlipidemia Father    Heart attack Father    Stroke Father     Outpatient Encounter Medications as of 04/13/2023  Medication Sig   Accu-Chek Softclix Lancets lancets Use to check blood glucose twice daily as directed.   albuterol (VENTOLIN HFA) 108 (90 Base) MCG/ACT inhaler Inhale 2 puffs every 4 hours by inhalation route.   Budeson-Glycopyrrol-Formoterol 160-9-4.8 MCG/ACT AERO INHALE 2 PUFFS BY ORAL INHALATION TWICE A DAY . RINSE MOUTH AFTER EACH USE TO PREVENT ORAL THRUSH.   budesonide (PULMICORT) 0.5 MG/2ML nebulizer solution Inhale 2 mL twice a day by nebulization route for 90 days.   buPROPion (ZYBAN) 150 MG 12 hr tablet Take 300 mg by mouth daily.  cholecalciferol (VITAMIN D3) 25 MCG (1000 UNIT) tablet Take 1,000 Units by mouth QID.   hydrOXYzine (ATARAX) 25 MG tablet Take 25 mg by mouth 3 (three) times daily.   insulin glargine-yfgn (SEMGLEE) 100 UNIT/ML Pen Inject 30 Units into the skin at bedtime.   Insulin Pen Needle (PEN NEEDLES) 30G X 8 MM MISC Use to inject insulin  daily as directed.   levothyroxine (SYNTHROID) 137 MCG tablet Take 1 tablet (137 mcg total) by mouth daily before breakfast.   metFORMIN (GLUCOPHAGE) 1000 MG tablet Take 1 tablet (1,000 mg total) by mouth 2 (two) times daily with a meal.   naltrexone (DEPADE) 50 MG tablet Take 100 mg by mouth daily.   Omega-3 Fatty Acids (FISH OIL OMEGA-3 PO) Take 2 capsules by mouth daily.   omeprazole (PRILOSEC) 40 MG capsule Take 1 capsule by mouth daily.   PARoxetine (PAXIL) 20 MG tablet Take 20 mg by mouth daily.   pregabalin (LYRICA) 200 MG capsule Take 200 mg by mouth 2 (two) times daily.   rosuvastatin (CRESTOR) 10 MG tablet Take 10 mg by mouth daily.   sildenafil (VIAGRA) 100 MG tablet Take 100 mg by mouth as  needed.   traZODone (DESYREL) 50 MG tablet Take 50 mg by mouth at bedtime.   [DISCONTINUED] levothyroxine (SYNTHROID) 150 MCG tablet Take 1 tablet (150 mcg total) by mouth daily before breakfast.   No facility-administered encounter medications on file as of 04/13/2023.    ALLERGIES: No Known Allergies  VACCINATION STATUS: Immunization History  Administered Date(s) Administered   Moderna Covid-19 Fall Seasonal Vaccine 69yrs & older 02/11/2023   PFIZER(Purple Top)SARS-COV-2 Vaccination 04/10/2020    Diabetes He presents for his follow-up diabetic visit. He has type 2 diabetes mellitus. Onset time: He was diagnosed at the proximal range of 59 years. His disease course has been worsening. There are no hypoglycemic associated symptoms. Pertinent negatives for hypoglycemia include no confusion, headaches, pallor or seizures. Pertinent negatives for diabetes include no chest pain, no fatigue, no polydipsia, no polyphagia, no polyuria and no weakness. There are no hypoglycemic complications. Symptoms are worsening. Risk factors for coronary artery disease include dyslipidemia, diabetes mellitus, family history, sedentary lifestyle, tobacco exposure and male sex. Current diabetic treatment includes oral agent (monotherapy). His weight is increasing steadily (Weight gain is appropriate response to his treatment since he will was adequate public state due to glucose toxicity prior to his last visit.). He is following a generally unhealthy diet. When asked about meal planning, he reported none. He has not had a previous visit with a dietitian. He rarely participates in exercise. His home blood glucose trend is increasing steadily. His breakfast blood glucose range is generally 140-180 mg/dl. His bedtime blood glucose range is generally 180-200 mg/dl. His overall blood glucose range is 180-200 mg/dl. (Gives history of alcohol abuse/chronic use requiring alcohol detox in November 2023.  He presents with above  target glycemic profile.  He is on basal insulin and metformin.  His point-of-care A1c is 8%, increasing progressively from 7%.     ) An ACE inhibitor/angiotensin II receptor blocker is not being taken.  Hyperlipidemia This is a chronic problem. The current episode started more than 1 year ago. The problem is controlled. Exacerbating diseases include diabetes. Pertinent negatives include no chest pain, myalgias or shortness of breath. Current antihyperlipidemic treatment includes statins. Risk factors for coronary artery disease include diabetes mellitus, dyslipidemia, male sex and a sedentary lifestyle.     Review of Systems  Constitutional:  Negative  for chills, fatigue, fever and unexpected weight change.  HENT:  Negative for dental problem, mouth sores and trouble swallowing.   Eyes:  Negative for visual disturbance.  Respiratory:  Negative for cough, choking, chest tightness, shortness of breath and wheezing.   Cardiovascular:  Negative for chest pain, palpitations and leg swelling.  Gastrointestinal:  Negative for abdominal distention, abdominal pain, constipation, diarrhea, nausea and vomiting.  Endocrine: Negative for polydipsia, polyphagia and polyuria.  Genitourinary:  Negative for dysuria, flank pain, hematuria and urgency.  Musculoskeletal:  Negative for back pain, gait problem, myalgias and neck pain.  Skin:  Negative for pallor, rash and wound.  Neurological:  Negative for seizures, syncope, weakness, numbness and headaches.  Psychiatric/Behavioral: Negative.  Negative for confusion and dysphoric mood.     Objective:       04/13/2023    1:25 PM 12/29/2022    2:01 PM 08/28/2022   10:38 AM  Vitals with BMI  Height 5\' 10"  5\' 10"    Weight 199 lbs 10 oz 201 lbs 3 oz 200 lbs 10 oz  BMI 28.64 28.87 28.78  Systolic 130 136   Diastolic 78 78   Pulse 96 72     BP 130/78   Pulse 96   Ht 5\' 10"  (1.778 m)   Wt 199 lb 9.6 oz (90.5 kg)   BMI 28.64 kg/m   Wt Readings from  Last 3 Encounters:  04/13/23 199 lb 9.6 oz (90.5 kg)  12/29/22 201 lb 3.2 oz (91.3 kg)  08/28/22 200 lb 6.4 oz (90.9 kg)      CMP ( most recent) CMP     Component Value Date/Time   NA 140 04/08/2023 1204   K 5.5 (H) 04/08/2023 1204   CL 100 04/08/2023 1204   CO2 28 04/08/2023 1204   GLUCOSE 149 (H) 04/08/2023 1204   GLUCOSE 86 08/26/2009 1925   BUN 15 04/08/2023 1204   CREATININE 0.84 04/08/2023 1204   CALCIUM 9.6 04/08/2023 1204   PROT 6.8 04/08/2023 1204   ALBUMIN 4.5 04/08/2023 1204   AST 23 04/08/2023 1204   ALT 27 04/08/2023 1204   ALKPHOS 101 04/08/2023 1204   BILITOT 0.5 04/08/2023 1204   GFRNONAA >60 08/26/2009 1925   GFRAA  08/26/2009 1925    >60        The eGFR has been calculated using the MDRD equation. This calculation has not been validated in all clinical situations. eGFR's persistently <60 mL/min signify possible Chronic Kidney Disease.  Lipid Panel     Component Value Date/Time   CHOL 159 04/08/2023 1204   TRIG 121 04/08/2023 1204   HDL 49 04/08/2023 1204   CHOLHDL 3.2 04/08/2023 1204   LDLCALC 88 04/08/2023 1204   LABVLDL 22 04/08/2023 1204      Assessment & Plan:   1. 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  Gives history of alcohol abuse/chronic use requiring alcohol detox in November 2023.  He presents with above target glycemic profile.  He is on basal insulin and metformin.  His point-of-care A1c is 8%, increasing progressively from 7%.    - I had a long discussion with him about the possible risk factors and  the pathology behind its diabetes and its complications. -his diabetes is complicated by alcoholism, chronic smoking, erectile dysfunction, sedentary life and he remains at a  high risk for more acute and chronic complications which include CAD, CVA,  CKD, retinopathy, and neuropathy. These are all discussed in detail with him.  - I discussed all available options of managing his diabetes including de-escalation of medications. I have counseled him on Food as Medicine by adopting a Whole Food , Plant Predominant  ( WFPP) nutrition as recommended by [employer] of Lifestyle Medicine. Patient is encouraged to switch to  unprocessed or minimally processed  complex starch, adequate protein intake (mainly plant source), minimal liquid fat, plenty of fruits, and vegetables. -  he is advised to stick to a routine mealtimes to eat 3 complete meals a day and snack only when necessary ( to snack only to correct hypoglycemia BG <70 day time or <100 at night).   - he acknowledges that there is a room for improvement in his food and drink choices. - Suggestion is made for him to avoid simple carbohydrates  from his diet including Cakes, Sweet Desserts, Ice Cream, Soda (diet and regular), Sweet Tea, Candies, Chips, Cookies, Store Bought Juices, Alcohol , Artificial Sweeteners,  Coffee Creamer, and "Sugar-free" Products, Lemonade. This will help patient to have more stable blood glucose profile and potentially avoid unintended weight gain.  The following Lifestyle Medicine recommendations according to American College of Lifestyle Medicine  [facility]) were discussed and and offered to patient and he  agrees to start the journey:  A. Whole Foods, Plant-Based Nutrition comprising of fruits and vegetables, plant-based proteins, whole-grain carbohydrates was discussed in detail with the patient.   A list for source of those nutrients were also provided to the patient.  Patient will use only water or unsweetened tea for hydration. B.  The need to stay away from risky substances including alcohol, smoking; obtaining 7 to 9 hours of restorative sleep, at least 150 minutes of moderate intensity exercise weekly, the importance of healthy social connections,  and stress  management techniques were discussed. C.  A full color page of  Calorie density of various food groups per pound showing examples of each food groups was provided to the patient.    - he will be scheduled with Norm Salt, RDN, CDE for individualized diabetes education.  - I have approached him with the following individualized plan to manage  his diabetes and patient agrees:  Given his history of heavy alcohol history, this patient will likely end up requiring basal/bolus insulin to control his glycemia to target.   -However, he still responds to basal insulin only.  He is advised to increase his Semglee to 40 units nightly, associated with monitoring of blood glucose twice a day-daily before breakfast and at bedtime.    - he is encouraged to call clinic for blood glucose levels less than 70 or above 300 mg /dl. -He is advised to continue metformin 1000 mg p.o. twice daily. -If he returns with his significant hyperglycemic profile, he will be considered for premixed insulin twice a day or additional bolus insulin.   He is not a suitable candidate for DPP 4 inhibitors nor GLP-1 receptor agonist due to risk of pancreatitis.  - Specific targets for  A1c;  LDL, HDL,  and Triglycerides were discussed with the patient.  2) Blood Pressure /Hypertension:   -His blood pressure is controlled to target.  He is not on antihypertensive medications, may not tolerate.  3) Lipids/Hyperlipidemia: Review of his recent lipid panel showed controlled LDL at 53.  He is advised to continue Crestor 10 g p.o. daily  at bedtime.  Side effects and precautions discussed with him.     4)  Weight/Diet:  Body mass index is 28.64 kg/m.  -    he is not a candidate for weight loss. I discussed with him the fact that loss of 5 - 10% of his  current body weight will have the most impact on his diabetes management.  The above detailed  ACLM recommendations for nutrition, exercise, sleep, social life, avoidance of risky  substances, the need for restorative sleep   information will also detailed on discharge instructions.  5) hypothyroidism: The circumstance of his diagnosis are not available to review.  His previsit thyroid function tests were consistent with slight over replacement.  I discussed and lowered his levothyroxine to 137 mcg p.o. daily before breakfast.     - We discussed about the correct intake of his thyroid hormone, on empty stomach at fasting, with water, separated by at least 30 minutes from breakfast and other medications,  and separated by more than 4 hours from calcium, iron, multivitamins, acid reflux medications (PPIs). -Patient is made aware of the fact that thyroid hormone replacement is needed for life, dose to be adjusted by periodic monitoring of thyroid function tests.    6) Chronic Care/Health Maintenance:  -he  is on  Statin medications , will not tolerate ACE inhibitor's and  is encouraged to initiate and continue to follow up with Ophthalmology, Dentist,  Podiatrist at least yearly or according to recommendations, and advised to  quit smoking. I have recommended yearly flu vaccine and pneumonia vaccine at least every 5 years; moderate intensity exercise for up to 150 minutes weekly; and  sleep for 7- 9 hours a day.  He is trying to achieve smoking cessation, currently on nicotine patch.  The patient was counseled on the dangers of tobacco use, and was advised to quit.  Reviewed strategies to maximize success, including removing cigarettes and smoking materials from environment. He also reports that he did not drink alcohol for 4 weeks now.  - he is  advised to maintain close follow up with Damaris Schooner, MD for primary care needs, as well as his other providers for optimal and coordinated care.   I spent  26  minutes in the care of the patient today including review of labs from CMP, Lipids, Thyroid Function, Hematology (current and previous including abstractions from other  facilities); face-to-face time discussing  his blood glucose readings/logs, discussing hypoglycemia and hyperglycemia episodes and symptoms, medications doses, his options of short and long term treatment based on the latest standards of care / guidelines;  discussion about incorporating lifestyle medicine;  and documenting the encounter. Risk reduction counseling performed per USPSTF guidelines to reduce cardiovascular risk factors.     Please refer to Patient Instructions for Blood Glucose Monitoring and Insulin/Medications Dosing Guide"  in media tab for additional information. Please  also refer to " Patient Self Inventory" in the Media  tab for reviewed elements of pertinent patient history.  Colin Austin participated in the discussions, expressed understanding, and voiced agreement with the above plans.  All questions were answered to his satisfaction. he is encouraged to contact clinic should he have any questions or concerns prior to his return visit.    Follow up plan: - Return in about 6 months (around 10/12/2023) for F/U with Pre-visit Labs, Meter/CGM/Logs, A1c here.  Marquis Lunch, MD [facility] [address] [place], [place] [address] Phone: [phone]  Fax: [phone]  04/13/2023, 2:22 PM  This note was partially dictated with voice recognition software. Similar sounding words can be transcribed inadequately or may not  be corrected upon review.

## 2023-04-13 NOTE — Patient Instructions (Signed)

## 2023-09-03 HISTORY — PX: LEG SURGERY: SHX1003

## 2023-09-30 ENCOUNTER — Telehealth: Payer: Self-pay | Admitting: "Endocrinology

## 2023-09-30 NOTE — Telephone Encounter (Signed)
 I called pt and LVM to return my call.  He needs to do labs by Friday for his appt on 6/9 with Nida. I am currently trying to get his new Auth from the [place]

## 2023-10-01 ENCOUNTER — Telehealth: Payer: Self-pay | Admitting: "Endocrinology

## 2023-10-01 NOTE — Telephone Encounter (Signed)
 Pt called and stated that he is in the hospital in [facility] for a broken leg. He will call back once discharged to get rescheduled then I will obtain an [place] Authorization for him

## 2023-10-12 ENCOUNTER — Ambulatory Visit: Payer: No Typology Code available for payment source | Admitting: "Endocrinology

## 2024-03-29 ENCOUNTER — Encounter: Payer: Self-pay | Admitting: "Endocrinology

## 2024-03-29 ENCOUNTER — Ambulatory Visit: Admitting: "Endocrinology

## 2024-03-29 VITALS — BP 126/82 | HR 84 | Ht 70.0 in | Wt 194.8 lb

## 2024-03-29 DIAGNOSIS — E039 Hypothyroidism, unspecified: Secondary | ICD-10-CM

## 2024-03-29 DIAGNOSIS — I10 Essential (primary) hypertension: Secondary | ICD-10-CM

## 2024-03-29 DIAGNOSIS — E1165 Type 2 diabetes mellitus with hyperglycemia: Secondary | ICD-10-CM | POA: Diagnosis not present

## 2024-03-29 DIAGNOSIS — Z794 Long term (current) use of insulin: Secondary | ICD-10-CM

## 2024-03-29 DIAGNOSIS — E782 Mixed hyperlipidemia: Secondary | ICD-10-CM | POA: Diagnosis not present

## 2024-03-29 MED ORDER — LEVOTHYROXINE SODIUM 150 MCG PO TABS: 150.0000 ug | ORAL_TABLET | Freq: Every day | ORAL | 1 refills | Status: AC

## 2024-03-29 MED ORDER — NOVOLOG MIX 70/30 FLEXPEN (70-30) 100 UNIT/ML ~~LOC~~ SUPN: 30.0000 [IU] | PEN_INJECTOR | Freq: Two times a day (BID) | SUBCUTANEOUS | 1 refills | Status: AC

## 2024-03-29 MED ORDER — EMPAGLIFLOZIN 10 MG PO TABS: 10.0000 mg | ORAL_TABLET | Freq: Every day | ORAL | 1 refills | Status: DC

## 2024-03-29 NOTE — Progress Notes (Signed)
 Endocrinology follow-up note       03/29/2024, 5:02 PM   Subjective:    Patient ID: Colin Austin, male    DOB: [birth year].  Colin Austin is being seen in follow-up after he was seen in consultation for management of currently uncontrolled symptomatic diabetes requested by  Daneen Dunnings, MD.   Past Medical History:  Diagnosis Date   Diabetes mellitus, type II (HCC)    Hypothyroidism     Past Surgical History:  Procedure Laterality Date   KNEE SURGERY     LEG SURGERY  09/2023   NOSE SURGERY      Social History   Socioeconomic History   Marital status: Single    Spouse name: Not on file   Number of children: Not on file   Years of education: Not on file   Highest education level: Not on file  Occupational History   Not on file  Tobacco Use   Smoking status: Every Day    Types: Cigarettes   Smokeless tobacco: Never  Vaping Use   Vaping status: Never Used  Substance and Sexual Activity   Alcohol use: Not Currently    Comment: sober 4-5 monyhd   Drug use: Never   Sexual activity: Not on file  Other Topics Concern   Not on file  Social History Narrative   Not on file   Social Drivers of Health   Financial Resource Strain: Low Risk  (09/27/2023)   Received from [facility]   Overall Financial Resource Strain (CARDIA)    Difficulty of Paying Living Expenses: Not hard at all  Food Insecurity: No Food Insecurity (09/27/2023)   Received from [facility]   Hunger Vital Sign    Within the past 12 months, you worried that your food would run out before you got the money to buy more.: Never true    Within the past 12 months, the food you bought just didn't last and you didn't have money to get more.: Never true  Transportation Needs: No Transportation Needs (09/27/2023)   Received from [facility] - Transportation    Lack of Transportation (Medical): No    Lack of  Transportation (Non-Medical): No  Physical Activity: Insufficiently Active (09/27/2023)   Received from [facility]   Exercise Vital Sign    On average, how many days per week do you engage in moderate to strenuous exercise (like a brisk walk)?: 3 days    On average, how many minutes do you engage in exercise at this level?: 20 min  Stress: No Stress Concern Present (09/27/2023)   Received from [facility] - Occupational Stress Questionnaire    Feeling of Stress : Not at all  Social Connections: Socially Isolated (09/27/2023)   Received from [facility]   Social Connection and Isolation Panel    In a typical week, how many times do you talk on the phone with family, friends, or neighbors?: Once a week    How often do you get together with friends or relatives?: Once a week    How often do you attend church or religious  services?: Never    Do you belong to any clubs or organizations such as church groups, unions, fraternal or athletic groups, or school groups?: No    How often do you attend meetings of the clubs or organizations you belong to?: Never    Are you married, widowed, divorced, separated, never married, or living with a partner?: Living with partner    Family History  Problem Relation Age of Onset   Cancer Mother    Thyroid disease Mother    Diabetes Mother    Cancer Father    Hypertension Father    Diabetes Father    Hyperlipidemia Father    Heart attack Father    Stroke Father     Outpatient Encounter Medications as of 03/29/2024  Medication Sig   empagliflozin  (JARDIANCE ) 10 MG TABS tablet Take 1 tablet (10 mg total) by mouth daily with breakfast.   insulin  aspart protamine - aspart (NOVOLOG  MIX 70/30 FLEXPEN) (70-30) 100 UNIT/ML FlexPen Inject 30 Units into the skin 2 (two) times daily with a meal.   [DISCONTINUED] insulin  glargine-yfgn (SEMGLEE ) 100 UNIT/ML Pen Inject 30 Units into the skin at bedtime. (Patient  taking differently: Inject 40 Units into the skin at bedtime.)   [DISCONTINUED] levothyroxine  (SYNTHROID ) 137 MCG tablet Take 1 tablet (137 mcg total) by mouth daily before breakfast. (Patient taking differently: Take 150 mcg by mouth daily before breakfast.)   Accu-Chek Softclix Lancets lancets Use to check blood glucose twice daily as directed.   albuterol (VENTOLIN HFA) 108 (90 Base) MCG/ACT inhaler Inhale 2 puffs every 4 hours by inhalation route.   Budeson-Glycopyrrol-Formoterol 160-9-4.8 MCG/ACT AERO INHALE 2 PUFFS BY ORAL INHALATION TWICE A DAY . RINSE MOUTH AFTER EACH USE TO PREVENT ORAL THRUSH.   budesonide (PULMICORT) 0.5 MG/2ML nebulizer solution Inhale 2 mL twice a day by nebulization route for 90 days.   buPROPion (ZYBAN) 150 MG 12 hr tablet Take 300 mg by mouth daily.   cholecalciferol (VITAMIN D3) 25 MCG (1000 UNIT) tablet Take 1,000 Units by mouth QID.   hydrOXYzine (ATARAX) 25 MG tablet Take 25 mg by mouth 3 (three) times daily.   Insulin  Pen Needle (PEN NEEDLES) 30G X 8 MM MISC Use to inject insulin   daily as directed.   levothyroxine  (SYNTHROID ) 150 MCG tablet Take 1 tablet (150 mcg total) by mouth daily before breakfast.   metFORMIN  (GLUCOPHAGE ) 1000 MG tablet Take 1 tablet (1,000 mg total) by mouth 2 (two) times daily with a meal.   naltrexone (DEPADE) 50 MG tablet Take 100 mg by mouth daily.   Omega-3 Fatty Acids (FISH OIL OMEGA-3 PO) Take 2 capsules by mouth daily.   omeprazole (PRILOSEC) 40 MG capsule Take 1 capsule by mouth daily.   PARoxetine (PAXIL) 20 MG tablet Take 20 mg by mouth daily.   pregabalin (LYRICA) 200 MG capsule Take 200 mg by mouth 2 (two) times daily.   rosuvastatin (CRESTOR) 10 MG tablet Take 10 mg by mouth daily.   sildenafil (VIAGRA) 100 MG tablet Take 100 mg by mouth as needed.   traZODone (DESYREL) 50 MG tablet Take 50 mg by mouth at bedtime.   No facility-administered encounter medications on file as of 03/29/2024.    ALLERGIES: No Known  Allergies  VACCINATION STATUS: Immunization History  Administered Date(s) Administered   Moderna Covid-19 Fall Seasonal Vaccine 92yrs & older 02/11/2023   PFIZER(Purple Top)SARS-COV-2 Vaccination 04/10/2020    Diabetes He presents for his follow-up diabetic visit. He has type 2 diabetes mellitus. Onset time:  He was diagnosed at the proximal range of 59 years. His disease course has been worsening. There are no hypoglycemic associated symptoms. Pertinent negatives for hypoglycemia include no confusion, headaches, pallor or seizures. Pertinent negatives for diabetes include no chest pain, no fatigue, no polydipsia, no polyphagia, no polyuria and no weakness. There are no hypoglycemic complications. Symptoms are worsening. Risk factors for coronary artery disease include dyslipidemia, diabetes mellitus, family history, sedentary lifestyle, tobacco exposure and male sex. His weight is decreasing steadily. He is following a generally unhealthy diet. When asked about meal planning, he reported none. He has not had a previous visit with a dietitian. He rarely participates in exercise. His home blood glucose trend is increasing steadily. His breakfast blood glucose range is generally >200 mg/dl. His bedtime blood glucose range is generally >200 mg/dl. His overall blood glucose range is >200 mg/dl. (Patient missed his appointment for almost a year.  He presents with significant loss of control in his glycemic profile with previsit A1c of 11.2% increasing from 8%.  He has a meter showing average glucose between 188-204 for the last 30 days.   ) An ACE inhibitor/angiotensin II receptor blocker is not being taken.  Hyperlipidemia This is a chronic problem. The current episode started more than 1 year ago. The problem is controlled. Exacerbating diseases include diabetes. Pertinent negatives include no chest pain, myalgias or shortness of breath. Current antihyperlipidemic treatment includes statins. Risk factors  for coronary artery disease include diabetes mellitus, dyslipidemia, male sex and a sedentary lifestyle.     Objective:       03/29/2024    3:17 PM 04/13/2023    1:25 PM 12/29/2022    2:01 PM  Vitals with BMI  Height 5' 10 5' 10 5' 10  Weight 194 lbs 13 oz 199 lbs 10 oz 201 lbs 3 oz  BMI 27.95 28.64 28.87  Systolic 126 130 863  Diastolic 82 78 78  Pulse 84 96 72    BP 126/82   Pulse 84   Ht 5' 10 (1.778 m)   Wt 194 lb 12.8 oz (88.4 kg)   BMI 27.95 kg/m   Wt Readings from Last 3 Encounters:  03/29/24 194 lb 12.8 oz (88.4 kg)  04/13/23 199 lb 9.6 oz (90.5 kg)  12/29/22 201 lb 3.2 oz (91.3 kg)      CMP ( most recent) CMP     Component Value Date/Time   NA 140 04/08/2023 1204   K 5.5 (H) 04/08/2023 1204   CL 100 04/08/2023 1204   CO2 28 04/08/2023 1204   GLUCOSE 149 (H) 04/08/2023 1204   GLUCOSE 86 08/26/2009 1925   BUN 15 04/08/2023 1204   CREATININE 0.84 04/08/2023 1204   CALCIUM 9.6 04/08/2023 1204   PROT 6.8 04/08/2023 1204   ALBUMIN 4.5 04/08/2023 1204   AST 23 04/08/2023 1204   ALT 27 04/08/2023 1204   ALKPHOS 101 04/08/2023 1204   BILITOT 0.5 04/08/2023 1204   GFRNONAA >60 08/26/2009 1925   GFRAA  08/26/2009 1925    >60        The eGFR has been calculated using the MDRD equation. This calculation has not been validated in all clinical situations. eGFR's persistently <60 mL/min signify possible Chronic Kidney Disease.  Lipid Panel     Component Value Date/Time   CHOL 159 04/08/2023 1204   TRIG 121 04/08/2023 1204   HDL 49 04/08/2023 1204   CHOLHDL 3.2 04/08/2023 1204   LDLCALC 88 04/08/2023 1204  LABVLDL 22 04/08/2023 1204      Assessment & Plan:   1. 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  Patient missed his appointment for almost a year.  He presents  with significant loss of control in his glycemic profile with previsit A1c of 11.2% increasing from 8%.  He has a meter showing average glucose between 188-204 for the last 30 days.    - I had a long discussion with him about the possible risk factors and  the pathology behind its diabetes and its complications. 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 These are all discussed in detail with him.  - I discussed all available options of managing his diabetes including de-escalation of medications.    Patient is encouraged to switch to  unprocessed or minimally processed  complex starch, adequate protein intake (mainly plant source), minimal liquid fat, plenty of fruits, and vegetables. -  he is advised to stick to a routine mealtimes to eat 3 complete meals a day and snack only when necessary ( to snack only to correct hypoglycemia BG <70 day time or <100 at night).  - he acknowledges that there is a room for improvement in his food and drink choices. - Suggestion is made for him to avoid simple carbohydrates  from his diet including Cakes, Sweet Desserts, Ice Cream, Soda (diet and regular), Sweet Tea, Candies, Chips, Cookies, Store Bought Juices, Alcohol , Artificial Sweeteners,  Coffee Creamer, and Sugar-free Products, Lemonade. This will help patient to have more stable blood glucose profile and potentially avoid unintended weight gain.  - he will be scheduled with Penny Crumpton, RDN, CDE for individualized diabetes education.  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  He will need multiple daily injections of insulin  in order for him to achieve  control of diabetes to target.    Accordingly, I have discussed initiated NovoLog  70/30 30 units with breakfast and 30 units with supper when his Premeal blood glucose readings are above 90 mg per DL.  He is advised to discontinue Semglee  when he feels a prescription for NovoLog  70/30.   - He is approached to monitor blood glucose 4 times a day-daily before meals and at bedtime.  He is also in the process of obtaining a CGM that will help him avoid multiple finger sticking daily. - he is encouraged to call clinic for blood glucose levels less than 70 or above 200 mg /dl. -He is advised to continue metformin  1000 mg p.o. twice daily. -I discussed and initiated Jardiance  10 mg p.o. daily at breakfast. - He is currently on portable oxygen canister, continues to smoke, he is not a suitable candidate for DPP 4 inhibitors nor GLP-1 receptor agonist due to risk of pancreatitis.  - Specific targets for  A1c;  LDL, HDL,  and Triglycerides were discussed with the patient.  2) Blood Pressure /Hypertension:   His blood pressure is controlled to target.  He is not on antihypertensive medications, may not tolerate.  3) Lipids/Hyperlipidemia: Review of his recent lipid panel  showed controlled LDL at 88.  He is advised to continue Crestor 10 mg p.o. daily at bedtime.  Side effects and precautions discussed with him.     4)  Weight/Diet:  Body mass index is 27.95 kg/m.  -    he is not a candidate for weight loss. I discussed with him the fact that loss of 5 - 10% of his  current body weight will have the most impact on his diabetes management.  The above detailed  ACLM recommendations for nutrition, exercise, sleep, social life, avoidance of risky substances, the need for restorative sleep   information will also detailed on discharge instructions.  5) hypothyroidism: The circumstance of his diagnosis are not available to review.  He does not have recent thyroid function test.  In the interval, his thyroid  hormone was increased to 150 mcg p.o. daily before breakfast.      - We discussed about the correct intake of his thyroid hormone, on empty stomach at fasting, with water, separated by at least 30 minutes from breakfast and other medications,  and separated by more than 4 hours from calcium, iron, multivitamins, acid reflux medications (PPIs). -Patient is made aware of the fact that thyroid hormone replacement is needed for life, dose to be adjusted by periodic monitoring of thyroid function tests.   6) Chronic Care/Health Maintenance:  -he  is on  Statin medications , will not tolerate ACE inhibitor's and  is encouraged to initiate and continue to follow up with Ophthalmology, Dentist,  Podiatrist at least yearly or according to recommendations, and advised to  quit smoking. I have recommended yearly flu vaccine and pneumonia vaccine at least every 5 years; moderate intensity exercise for up to 150 minutes weekly; and  sleep for 7- 9 hours a day.   - he is  advised to maintain close follow up with Zaman, Mufrad, MD for primary care needs, as well as his other providers for optimal and coordinated care.   I spent  42  minutes in the care of the patient today including review of labs from CMP, Lipids, Thyroid Function, Hematology (current and previous including abstractions from other facilities); face-to-face time discussing  his blood glucose readings/logs, discussing hypoglycemia and hyperglycemia episodes and symptoms, medications doses, his options of short and long term treatment based on the latest standards of care / guidelines;  discussion about incorporating lifestyle medicine;  and documenting the encounter. Risk reduction counseling performed per USPSTF guidelines to reduce cardiovascular risk factors.     Please refer to Patient Instructions for Blood Glucose Monitoring and Insulin /Medications Dosing Guide  in media tab for additional information. Please  also refer to  Patient Self  Inventory in the Media  tab for reviewed elements of pertinent patient history.  Colin Austin participated in the discussions, expressed understanding, and voiced agreement with the above plans.  All questions were answered to his satisfaction. he is encouraged to contact clinic should he have any questions or concerns prior to his return visit.   Follow up plan: - Return in about 3 months (around 06/29/2024) for F/U with Pre-visit Labs, Meter/CGM/Logs, A1c here.  Ranny Earl, MD [facility] [address] [place], [place] [address] Phone: [phone]  Fax: [phone]    03/29/2024, 5:02 PM  This note was partially dictated with voice recognition software. Similar sounding words can be transcribed inadequately or may not  be corrected upon review.

## 2024-03-29 NOTE — Patient Instructions (Signed)

## 2024-04-06 ENCOUNTER — Other Ambulatory Visit: Payer: Self-pay | Admitting: "Endocrinology

## 2024-04-06 MED ORDER — EMPAGLIFLOZIN 25 MG PO TABS: ORAL_TABLET | ORAL | 1 refills | Status: AC

## 2024-07-04 ENCOUNTER — Ambulatory Visit: Admitting: "Endocrinology
